# Patient Record
Sex: Female | Born: 1965 | Race: White | Hispanic: Yes | Marital: Married | Smoking: Never smoker
Health system: Southern US, Community
[De-identification: ages and names within clinical notes are randomized; demographics above are authoritative.]

## PROBLEM LIST (undated history)

## (undated) HISTORY — PX: TUBAL LIGATION: SHX77

---

## 2018-08-23 ENCOUNTER — Other Ambulatory Visit: Payer: Self-pay

## 2018-08-30 LAB — CYTOLOGY - PAP: Diagnosis: NEGATIVE

## 2019-07-28 ENCOUNTER — Ambulatory Visit: Payer: Self-pay

## 2019-08-02 ENCOUNTER — Other Ambulatory Visit: Payer: Self-pay

## 2019-08-02 ENCOUNTER — Ambulatory Visit (INDEPENDENT_AMBULATORY_CARE_PROVIDER_SITE_OTHER): Payer: Self-pay | Admitting: Internal Medicine

## 2019-08-02 DIAGNOSIS — R2242 Localized swelling, mass and lump, left lower limb: Secondary | ICD-10-CM

## 2019-08-02 DIAGNOSIS — Z9189 Other specified personal risk factors, not elsewhere classified: Secondary | ICD-10-CM

## 2019-08-02 DIAGNOSIS — M7989 Other specified soft tissue disorders: Secondary | ICD-10-CM

## 2019-08-02 DIAGNOSIS — M5431 Sciatica, right side: Secondary | ICD-10-CM

## 2019-08-02 NOTE — Progress Notes (Signed)
Virtual Visit via Telephone Note Due to current restrictions/limitations of in-office visits due to the COVID-19 pandemic, this scheduled clinical appointment was converted to a telehealth visit  I connected with Tobie Lords on 08/02/19 at 4:45 p.m by telephone and verified that I am speaking with the correct person using two identifiers. I am in my office.  The patient is at home.  Only the patient,myself and Luis from [employer] [phone]) participated in this encounter.  I discussed the limitations, risks, security and privacy concerns of performing an evaluation and management service by telephone and the availability of in person appointments. I also discussed with the patient that there may be a patient responsible charge related to this service. The patient expressed understanding and agreed to proceed.   History of Present Illness: Pt is new to this practice at [place].  Last PCP was in [place], moved to [place] 1 yr ago.  Denies any chronic medical issues and not on any meds beside MV.    C/o having severe pain in the middle of lower back that started 1 wk ago.  Associated with pain in RT leg.  No known precipitating factors.  Reports similar episode 18 yrs ago where she was dx with sciatica and had to have a steroid inj.   -Last wk she Used [employer] and massages from spouse.  Used a brace for next 2 days and tried to avoid excessive activity.  Symptoms have resolved.    C/o swelling in LT leg x 6 mths-1 yr. at the end of the day she reports for the leg feels tired.  Denies any cramps in the leg.  Pain in same leg when weather is call.  Would like dental referral for routine care.  Has been approved for OC.   PFhx: Crohn's and HTN in mother who is deceased.  Father has DM and glaucoma  PSH: tubal ligation Social history: Updated in the system.  HM:  Last pap was 08/2018 in [facility].  Last MMG was over 2 yrs ago.  Due for flu vaccine.  She has appt this weekend to get a  free flu vaccine in the community Observations/Objective: No direct observation done as this was a telephone encounter  Assessment and Plan: 1. Sciatica of right side -Given that symptoms have resolved, no need for medication at this time.  I recommend avoiding heavy lifting, excessive pushing/pulling or any other activity that may cause flareup.  2. Need for dental care Referral submitted for her to see the dentist  3. Left leg swelling This is chronic so I doubt DVT.  Patient was a very poor historian over the phone when trying to get details about this particular symptom.  We will bring her in and several weeks to discuss and evaluate further.   Follow Up Instructions: 4 wks to eval LT leg   I discussed the assessment and treatment plan with the patient. The patient was provided an opportunity to ask questions and all were answered. The patient agreed with the plan and demonstrated an understanding of the instructions.   The patient was advised to call back or seek an in-person evaluation if the symptoms worsen or if the condition fails to improve as anticipated.  I provided 27 minutes of non-face-to-face time during this encounter.   Karle Plumber, MD

## 2019-09-05 ENCOUNTER — Telehealth: Payer: Self-pay

## 2019-09-05 NOTE — Telephone Encounter (Signed)
Called patient to do their pre-visit COVID screening.  Call went to voicemail. Unable to do prescreening.  

## 2019-09-06 ENCOUNTER — Other Ambulatory Visit: Payer: Self-pay

## 2019-09-06 ENCOUNTER — Ambulatory Visit (INDEPENDENT_AMBULATORY_CARE_PROVIDER_SITE_OTHER): Payer: Self-pay | Admitting: Internal Medicine

## 2019-09-06 VITALS — BP 111/79 | HR 84 | Temp 97.3°F | Resp 17 | Ht 66.0 in | Wt 166.2 lb

## 2019-09-06 DIAGNOSIS — R609 Edema, unspecified: Secondary | ICD-10-CM

## 2019-09-06 DIAGNOSIS — Z1211 Encounter for screening for malignant neoplasm of colon: Secondary | ICD-10-CM

## 2019-09-06 DIAGNOSIS — R600 Localized edema: Secondary | ICD-10-CM

## 2019-09-06 NOTE — Progress Notes (Signed)
Patient ID: Hannah Tate, female    DOB: 1966-07-24  MRN: [id number]  CC: Leg Swelling   Subjective: Hannah Tate is a 53 y.o. female who presents for f/u leg edema Her concerns today include:   Pt had tele-visit 1 mth ago.  C/o intermittent swelling in the LT leg x 6-12 mths.  Today she states both legs swell but worse on LT.  Worse when sitting for prolong periods "like when I take long trips."  Also worse when she is ambulatory during the day.  Better when she is laying down and with elevation of legs. Legs feel heavy during the day No pain in the legs.   No SOB at rest or exertion     No current outpatient medications on file prior to visit.   No current facility-administered medications on file prior to visit.     No Known Allergies  Social History   Socioeconomic History  . Marital status: Married    Spouse name: Not on file  . Number of children: 3  . Years of education: 81  . Highest education level: Not on file  Occupational History  . Not on file  Social Needs  . Financial resource strain: Not on file  . Food insecurity    Worry: Not on file    Inability: Not on file  . Transportation needs    Medical: Not on file    Non-medical: Not on file  Tobacco Use  . Smoking status: Never Smoker  . Smokeless tobacco: Never Used  Substance and Sexual Activity  . Alcohol use: Not Currently  . Drug use: Never  . Sexual activity: Not on file  Lifestyle  . Physical activity    Days per week: Not on file    Minutes per session: Not on file  . Stress: Not on file  Relationships  . Social Musician on phone: Not on file    Gets together: Not on file    Attends religious service: Not on file    Active member of club or organization: Not on file    Attends meetings of clubs or organizations: Not on file    Relationship status: Not on file  . Intimate partner violence    Fear of current or ex partner: Not on file    Emotionally abused: Not on file     Physically abused: Not on file    Forced sexual activity: Not on file  Other Topics Concern  . Not on file  Social History Narrative  . Not on file    Family History  Problem Relation Age of Onset  . Hypertension Mother   . Crohn's disease Mother   . Diabetes Father   . Skin cancer Father   . Glaucoma Father   . Glaucoma Sister   . Skin cancer Paternal Uncle     Past Surgical History:  Procedure Laterality Date  . TUBAL LIGATION      ROS: Review of Systems Negative except as stated above  PHYSICAL EXAM: BP 111/79   Pulse 84   Temp (!) 97.3 F (36.3 C) (Temporal)   Resp 17   Ht 5\' 6"  (1.676 m)   Wt 166 lb 3.2 oz (75.4 kg)   SpO2 99%   BMI 26.83 kg/m   Physical Exam  General appearance - alert, well appearing, and in no distress Mental status - normal mood, behavior, speech, dress, motor activity, and thought processes Chest - clear to auscultation,  no wheezes, rales or rhonchi, symmetric air entry Heart - normal rate, regular rhythm, normal S1, S2, no murmurs, rubs, clicks or gallops Extremities -no discrepancy size of the left leg compared to the right.  No edema in the lower legs at this time.  A few spider veins noted on the left lower leg.  Legs are warm.  Good peripheral pulses in the feet popliteal and femoral areas.   No flowsheet data found. Lipid Panel  No results found for: CHOL, TRIG, HDL, CHOLHDL, VLDL, LDLCALC, LDLDIRECT  CBC No results found for: WBC, RBC, HGB, HCT, PLT, MCV, MCH, MCHC, RDW, LYMPHSABS, MONOABS, EOSABS, BASOSABS  ASSESSMENT AND PLAN:  1. Dependent edema History suggests dependent edema.  No swelling noted at this time.  I recommend purchasing and wearing some compression socks during the day when she is up and active and also when she anticipates prolonged sitting.  2. Colon cancer screening Discussed colon cancer screening.  Patient agreeable to doing fit test. - Fecal occult blood, imunochemical([facility]/Sun[facility]);  Future    Patient was given the opportunity to ask questions.  Patient verbalized understanding of the plan and was able to repeat key elements of the plan.  #[id number] Orders Placed This Encounter  Procedures  . Fecal occult blood, imunochemical([facility]/Sun[facility])     Requested Prescriptions    No prescriptions requested or ordered in this encounter    Return if symptoms worsen or fail to improve.  Karle Plumber, MD, FACP

## 2019-09-06 NOTE — Progress Notes (Signed)
Patient following up on B leg swelling. States that the swelling is from her knees down. Swelling does improve when she's off of her feet.  Would like to have A1C done to see if she's still in the prediabetic range.

## 2019-09-16 ENCOUNTER — Other Ambulatory Visit: Payer: Self-pay

## 2019-09-16 ENCOUNTER — Ambulatory Visit: Payer: Self-pay | Attending: Family Medicine

## 2019-09-16 ENCOUNTER — Ambulatory Visit: Payer: Self-pay

## 2020-07-26 ENCOUNTER — Other Ambulatory Visit: Payer: Self-pay | Admitting: Obstetrics and Gynecology

## 2020-07-26 DIAGNOSIS — Z1231 Encounter for screening mammogram for malignant neoplasm of breast: Secondary | ICD-10-CM

## 2020-09-06 ENCOUNTER — Ambulatory Visit
Admission: RE | Admit: 2020-09-06 | Discharge: 2020-09-06 | Disposition: A | Discharge status: Home / Self Care | Payer: No Typology Code available for payment source | Source: Ambulatory Visit | Attending: Obstetrics and Gynecology | Admitting: Obstetrics and Gynecology

## 2020-09-06 ENCOUNTER — Other Ambulatory Visit: Payer: Self-pay

## 2020-09-06 ENCOUNTER — Ambulatory Visit: Payer: No Typology Code available for payment source | Admitting: *Deleted

## 2020-09-06 VITALS — BP 122/82 | Wt 168.4 lb

## 2020-09-06 DIAGNOSIS — Z1239 Encounter for other screening for malignant neoplasm of breast: Secondary | ICD-10-CM

## 2020-09-06 DIAGNOSIS — Z1231 Encounter for screening mammogram for malignant neoplasm of breast: Secondary | ICD-10-CM

## 2020-09-06 DIAGNOSIS — Z1211 Encounter for screening for malignant neoplasm of colon: Secondary | ICD-10-CM

## 2020-09-06 NOTE — Progress Notes (Signed)
[name] is a 54 y.o. female who presents to [facility] clinic today with no complaints.    Pap Smear: Pap not smear completed today. Last Pap smear was 08/23/2018 at the free cervical cancer screening clinic at [facility] and was normal. Per patient has history of an abnormal Pap smear 13 years ago that a colposcopy was completed for follow-up. Patient stated all Pap smears have been normal since colposcopy and has had at least three normal Pap smears. Last Pap smear result is available in Epic.   Physical exam: Breasts Left breast slightly larger than right that per patient is normal for her. No skin abnormalities bilateral breasts. No nipple retraction bilateral breasts. No nipple discharge bilateral breasts. No lymphadenopathy. No lumps palpated bilateral breasts. No complaints of pain or tenderness on exam.      Pelvic/Bimanual Pap is not indicated today per BCCCP guidelines.   Smoking History: Patient has never smoked.   Patient Navigation: Patient education provided. Access to services provided for patient through [facility] program. Spanish interpreter Natale Lay from [facility] provided.   Colorectal Cancer Screening: Per patient has never had colonoscopy completed. FIT Test given to patient to complete. No complaints today.    Breast and Cervical Cancer Risk Assessment: Patient does not have family history of breast cancer, known genetic mutations, or radiation treatment to the chest before age 58. Per patient has history of cervical dysplasia. Patient has no history of being immunocompromised or DES exposure in-utero.  Risk Assessment    Risk Scores      09/06/2020   Last edited by: [name], CMA   5-year risk: 0.5 %   Lifetime risk: 4.5 %          A: BCCCP exam without pap smear No complaints.  P: Referred patient to the [facility] for a screening mammogram on the mobile unit. Appointment scheduled Thursday, September 06, 2020 at 1430.  Priscille Heidelberg, RN 09/06/2020 4:03 PM

## 2020-09-06 NOTE — Patient Instructions (Signed)
Explained breast self awareness with Dawna Part. Patient did not need a Pap smear today due to last Pap smear was 08/23/2018. Let her know BCCCP will cover Pap smears every 3 years unless has a history of abnormal Pap smears. Referred patient to the [facility] for a screening mammogram on the mobile unit. Appointment scheduled Thursday, September 06, 2020 at 1430. Patient escorted to the mobile unit following BCCCP appointment for her screening mammogram. Let patient know the Breast Center will follow up with her within the next couple weeks with results of her mammogram by letter or phone. Dawna Part verbalized understanding.  Zavier Canela, Kathaleen Maser, RN 4:03 PM

## 2020-09-19 ENCOUNTER — Other Ambulatory Visit: Payer: Self-pay | Admitting: Obstetrics and Gynecology

## 2020-09-19 DIAGNOSIS — R928 Other abnormal and inconclusive findings on diagnostic imaging of breast: Secondary | ICD-10-CM

## 2020-10-02 ENCOUNTER — Other Ambulatory Visit: Payer: Self-pay

## 2020-10-02 ENCOUNTER — Ambulatory Visit
Admission: RE | Admit: 2020-10-02 | Discharge: 2020-10-02 | Disposition: A | Discharge status: Home / Self Care | Payer: No Typology Code available for payment source | Source: Ambulatory Visit | Attending: Obstetrics and Gynecology | Admitting: Obstetrics and Gynecology

## 2020-10-02 ENCOUNTER — Other Ambulatory Visit: Payer: Self-pay | Admitting: Obstetrics and Gynecology

## 2020-10-02 DIAGNOSIS — R928 Other abnormal and inconclusive findings on diagnostic imaging of breast: Secondary | ICD-10-CM

## 2020-11-28 ENCOUNTER — Emergency Department: Admit: 2020-11-28 | Discharge status: Absent without leave | Payer: Self-pay

## 2020-11-28 ENCOUNTER — Other Ambulatory Visit: Payer: Self-pay

## 2020-11-28 ENCOUNTER — Emergency Department (INDEPENDENT_AMBULATORY_CARE_PROVIDER_SITE_OTHER)
Admission: EM | Admit: 2020-11-28 | Discharge: 2020-11-28 | Disposition: A | Discharge status: Home / Self Care | Payer: Self-pay | Source: Home / Self Care

## 2020-11-28 DIAGNOSIS — G43919 Migraine, unspecified, intractable, without status migrainosus: Secondary | ICD-10-CM

## 2020-11-28 MED ORDER — CYCLOBENZAPRINE HCL 10 MG PO TABS: 10.0000 mg | ORAL_TABLET | Freq: Every day | ORAL | 0 refills | Status: DC

## 2020-11-28 NOTE — ED Provider Notes (Signed)
[facility]    CSN: [id number] Arrival date & time: 11/28/20  1640      History   Chief Complaint Chief Complaint  Patient presents with  . Headache    HPI Hannah Tate is a 55 y.o. female.   Spanish interpreter utilized to facilitate communication during visit  HPI  Patient with a history of recurrent migrainous headaches Presents today concern that she noticed her temporal vein on right side of her forehead yesterday. She did not experienced a headache at that time but had headache this morning and still noticed the visibility of the right temporal vein today. She denies dizziness, palpable tenderness of vein, weakness, changes in vision, or headache worst than any prior headaches.    History reviewed. No pertinent past medical history.  There are no problems to display for this patient.   Past Surgical History:  Procedure Laterality Date  . TUBAL LIGATION      OB History    Gravida  4   Para      Term      Preterm      AB  1   Living        SAB  1   IAB      Ectopic      Multiple      Live Births  3            Home Medications    Prior to Admission medications   Not on File    Family History Family History  Problem Relation Age of Onset  . Hypertension Mother   . Crohn's disease Mother   . Diabetes Father   . Skin cancer Father   . Glaucoma Father   . Glaucoma Sister   . Skin cancer Paternal Uncle     Social History Social History   Tobacco Use  . Smoking status: Never Smoker  . Smokeless tobacco: Never Used  Vaping Use  . Vaping Use: Never used  Substance Use Topics  . Alcohol use: Not Currently  . Drug use: Never     Allergies   Patient has no known allergies.   Review of Systems Review of Systems Pertinent negatives listed in HPI   Physical Exam Triage Vital Signs ED Triage Vitals  Enc Vitals Group     BP 11/28/20 1739 133/86     Pulse Rate 11/28/20 1739 (!) 118     Resp 11/28/20  1739 18     Temp 11/28/20 1739 98.4 F (36.9 C)     Temp Source 11/28/20 1739 Oral     SpO2 11/28/20 1739 100 %     Weight --      Height --      Head Circumference --      Peak Flow --      Pain Score 11/28/20 1740 6     Pain Loc --      Pain Edu? --      Excl. in GC? --    No data found.  Updated Vital Signs BP 133/86 (BP Location: Right Arm)   Pulse (!) 118   Temp 98.4 F (36.9 C) (Oral)   Resp 18   SpO2 100%   Visual Acuity Right Eye Distance:   Left Eye Distance:   Bilateral Distance:    Right Eye Near:   Left Eye Near:    Bilateral Near:     Physical Exam Constitutional: Patient appears well-developed and well-nourished. No distress. HENT: Normocephalic,  atraumatic, External right and left ear normal. Oropharynx is clear and moist.  Eyes: Conjunctivae and EOM are normal. PERRLA, no scleral icterus. Neck: Normal ROM. Neck supple. No JVD. No tracheal deviation. No thyromegaly. CVS: RRR, S1/S2 +, no murmurs, no gallops, no carotid bruit.  Pulmonary: Effort and breath sounds normal, no stridor, rhonchi, wheezes, rales.  Musculoskeletal: Normal range of motion. No edema and no tenderness.  Neuro: Alert. Normal reflexes, muscle tone coordination. No cranial nerve deficit. Negative of focal deficits. Cerebellar function intact. Negative of nystagmus. Negative Romberg  Skin: Skin is warm and dry. No rash noted. Not diaphoretic. No erythema. No pallor. Psychiatric: Normal mood and affect. Behavior, judgment, thought content normal.  UC Treatments / Results  Labs (all labs ordered are listed, but only abnormal results are displayed) Labs Reviewed - No data to display  EKG   Radiology No results found.  Procedures Procedures (including critical care time)  Medications Ordered in UC Medications - No data to display  Initial Impression / Assessment and Plan / UC Course  I have reviewed the triage vital signs and the nursing notes.  Pertinent labs & imaging  results that were available during my care of the patient were reviewed by me and considered in my medical decision making (see chart for details).    Chronic migraine HA, educated to avoid taking ASA for HA management due to increase risk of GI bleeding. Currently no HA pain. Educated visibility of a temporal painless vein in not a specific diagnostic finding. Overall neurological exam grossly intact.  Muscle relaxer prescribed PRN for HA to take with either ibuprofen or naproxen. Pt is w/o PCP and no recent full PE. Information given to schedule an appt with PCP to have a CPE completed. Red flag signs discussed and symptoms warranting emergent evaluation. Patient verbalized understanding and agreement with plan.   Final Clinical Impressions(s) / UC Diagnoses   Final diagnoses:  Intractable migraine without status migrainosus, unspecified migraine type     Discharge Instructions     For acute headache pain continue management with ibuprofen migraine and Tylenol.  For headache pain and headache tension at bedtime you can take cyclobenzaprine however only take at bedtime as this medication can cause drowsiness.  Schedule follow-up with the provider listed on your paper for complete physical exam and further management of recurrent headaches.    ED Prescriptions    Medication Sig Dispense Auth. Provider   cyclobenzaprine (FLEXERIL) 10 MG tablet Take 1 tablet (10 mg total) by mouth at bedtime. 20 tablet Bing Neighbors, FNP     PDMP not reviewed this encounter.   Bing Neighbors, FNP 12/01/20 2150

## 2020-11-28 NOTE — Discharge Instructions (Addendum)
For acute headache pain continue management with ibuprofen migraine and Tylenol.  For headache pain and headache tension at bedtime you can take cyclobenzaprine however only take at bedtime as this medication can cause drowsiness.  Schedule follow-up with the provider listed on your paper for complete physical exam and further management of recurrent headaches.

## 2020-11-28 NOTE — ED Triage Notes (Signed)
Pt c/o headache x 2 days. Pain in her temporal area. Had covid in December. Had had covid vaccines. Aspirin and tylenol prn. Pain 6/10

## 2021-04-02 ENCOUNTER — Other Ambulatory Visit: Payer: No Typology Code available for payment source

## 2021-04-18 ENCOUNTER — Ambulatory Visit
Admission: RE | Admit: 2021-04-18 | Discharge: 2021-04-18 | Disposition: A | Discharge status: Home / Self Care | Payer: No Typology Code available for payment source | Source: Ambulatory Visit | Attending: Obstetrics and Gynecology | Admitting: Obstetrics and Gynecology

## 2021-04-18 ENCOUNTER — Other Ambulatory Visit: Payer: Self-pay

## 2021-04-18 DIAGNOSIS — R928 Other abnormal and inconclusive findings on diagnostic imaging of breast: Secondary | ICD-10-CM

## 2021-05-02 ENCOUNTER — Other Ambulatory Visit: Payer: Self-pay

## 2021-05-02 ENCOUNTER — Ambulatory Visit (INDEPENDENT_AMBULATORY_CARE_PROVIDER_SITE_OTHER): Payer: Self-pay | Admitting: Family

## 2021-05-02 ENCOUNTER — Encounter: Payer: Self-pay | Admitting: Family

## 2021-05-02 VITALS — BP 122/70 | HR 97 | Temp 97.8°F | Ht 64.96 in | Wt 168.2 lb

## 2021-05-02 DIAGNOSIS — Z0000 Encounter for general adult medical examination without abnormal findings: Secondary | ICD-10-CM

## 2021-05-02 DIAGNOSIS — Z13220 Encounter for screening for lipoid disorders: Secondary | ICD-10-CM

## 2021-05-02 LAB — CBC WITH DIFFERENTIAL/PLATELET
Basophils Absolute: 0 10*3/uL (ref 0.0–0.1)
Basophils Relative: 1 % (ref 0.0–3.0)
Eosinophils Absolute: 0.2 10*3/uL (ref 0.0–0.7)
Eosinophils Relative: 3.9 % (ref 0.0–5.0)
HCT: 36.8 % (ref 36.0–46.0)
Hemoglobin: 12.1 g/dL (ref 12.0–15.0)
Lymphocytes Relative: 34.5 % (ref 12.0–46.0)
Lymphs Abs: 1.6 10*3/uL (ref 0.7–4.0)
MCHC: 33 g/dL (ref 30.0–36.0)
MCV: 82.3 fl (ref 78.0–100.0)
Monocytes Absolute: 0.4 10*3/uL (ref 0.1–1.0)
Monocytes Relative: 8.1 % (ref 3.0–12.0)
Neutro Abs: 2.4 10*3/uL (ref 1.4–7.7)
Neutrophils Relative %: 52.5 % (ref 43.0–77.0)
Platelets: 279 10*3/uL (ref 150.0–400.0)
RBC: 4.47 Mil/uL (ref 3.87–5.11)
RDW: 16.4 % — ABNORMAL HIGH (ref 11.5–15.5)
WBC: 4.6 10*3/uL (ref 4.0–10.5)

## 2021-05-02 LAB — COMPREHENSIVE METABOLIC PANEL
ALT: 18 U/L (ref 0–35)
AST: 16 U/L (ref 0–37)
Albumin: 4.2 g/dL (ref 3.5–5.2)
Alkaline Phosphatase: 62 U/L (ref 39–117)
BUN: 16 mg/dL (ref 6–23)
CO2: 28 mEq/L (ref 19–32)
Calcium: 9.4 mg/dL (ref 8.4–10.5)
Chloride: 105 mEq/L (ref 96–112)
Creatinine, Ser: 0.79 mg/dL (ref 0.40–1.20)
GFR: 85.94 mL/min (ref >60.00)
Glucose, Bld: 87 mg/dL (ref 70–99)
Potassium: 4.5 mEq/L (ref 3.5–5.1)
Sodium: 140 mEq/L (ref 135–145)
Total Bilirubin: 0.3 mg/dL (ref 0.2–1.2)
Total Protein: 7 g/dL (ref 6.0–8.3)

## 2021-05-02 LAB — LIPID PANEL
Cholesterol: 142 mg/dL (ref 0–200)
HDL: 37.5 mg/dL — ABNORMAL LOW (ref >39.00)
LDL Cholesterol: 85 mg/dL (ref 0–99)
NonHDL: 104.63
Total CHOL/HDL Ratio: 4
Triglycerides: 97 mg/dL (ref 0.0–149.0)
VLDL: 19.4 mg/dL (ref 0.0–40.0)

## 2021-05-02 NOTE — Progress Notes (Signed)
Hannah Tate is a 55 y.o. female with the following history as recorded in EpicCare:  There are no problems to display for this patient.   No current outpatient medications on file.   No current facility-administered medications for this visit.    Allergies: Patient has no known allergies.  No past medical history on file.  Past Surgical History:  Procedure Laterality Date   TUBAL LIGATION      Family History  Problem Relation Age of Onset   Hypertension Mother    Crohn's disease Mother    Diabetes Father    Skin cancer Father    Glaucoma Father    Glaucoma Sister    Skin cancer Paternal Uncle     Social History   Tobacco Use   Smoking status: Never   Smokeless tobacco: Never  Substance Use Topics   Alcohol use: Not Currently    Subjective:  Presents today as a new patient; accompanied by translator; no acute concerns- would like to get fasting labs/ CPE today;   Originially from [place]- moved from [place] to [place] 3 years ago to help take care of grandchildren; 1 daughter in [place]/ 2 sons in [place];   Up to date on mammogram; due again in December 2022; she will call the [facility] to get scheduled;   LMP- June/ irregular; last pap smear 08/2018; menstrual migraines- responds well to Excedrin Migraine; does have fluid retention in the week leading up to her period as well;   Review of Systems  Constitutional: Negative.   HENT: Negative.    Eyes: Negative.   Respiratory: Negative.    Cardiovascular: Negative.   Gastrointestinal: Negative.   Genitourinary: Negative.   Musculoskeletal: Negative.   Skin: Negative.   Neurological: Negative.   Endo/Heme/Allergies: Negative.   Psychiatric/Behavioral: Negative.         Objective:  Vitals:   05/02/21 0912  BP: 122/70  Pulse: 97  Temp: 97.8 F (36.6 C)  TempSrc: Oral  SpO2: 99%  Weight: 168 lb 3.2 oz (76.3 kg)  Height: 5' 4.96" (1.65 m)    General: Well developed, well nourished, in no acute  distress  Skin : Warm and dry.  Head: Normocephalic and atraumatic  Eyes: Sclera and conjunctiva clear; pupils round and reactive to light; extraocular movements intact  Ears: External normal; canals clear; tympanic membranes normal  Oropharynx: Pink, supple. No suspicious lesions  Neck: Supple without thyromegaly, adenopathy  Lungs: Respirations unlabored; clear to auscultation bilaterally without wheeze, rales, rhonchi  CVS exam: normal rate and regular rhythm.  Abdomen: Soft; nontender; nondistended; normoactive bowel sounds; no masses or hepatosplenomegaly  Musculoskeletal: No deformities; no active joint inflammation  Extremities: No edema, cyanosis, clubbing  Vessels: Symmetric bilaterally  Neurologic: Alert and oriented; speech intact; face symmetrical; moves all extremities well; CNII-XII intact without focal deficit   Assessment:  1. PE (physical exam), annual   2. Lipid screening     Plan:  Age appropriate preventive healthcare needs addressed; encouraged regular eye doctor and dental exams; encouraged regular exercise; will update labs and refills as needed today; follow-up to be determined; Due to lack of insurance, will postpone vaccines and colon cancer screening at this time; Plan for pap smear later this year- will need to see if [facility] offers free community health screenings;  This visit occurred during the SARS-CoV-2 public health emergency.  Safety protocols were in place, including screening questions prior to the visit, additional usage of staff PPE, and extensive cleaning of exam  room while observing appropriate contact time as indicated for disinfecting solutions.    No follow-ups on file.  Orders Placed This Encounter  Procedures   CBC with Differential/Platelet   Comp Met (CMET)   Lipid panel    Requested Prescriptions    No prescriptions requested or ordered in this encounter

## 2021-08-22 ENCOUNTER — Encounter: Payer: Self-pay | Admitting: Physician Assistant

## 2021-08-22 ENCOUNTER — Ambulatory Visit: Payer: Self-pay | Attending: Physician Assistant | Admitting: Physician Assistant

## 2021-08-22 ENCOUNTER — Other Ambulatory Visit: Payer: Self-pay

## 2021-08-22 VITALS — BP 120/81 | HR 79 | Temp 98.7°F | Resp 18 | Ht 67.0 in | Wt 167.0 lb

## 2021-08-22 DIAGNOSIS — N951 Menopausal and female climacteric states: Secondary | ICD-10-CM | POA: Insufficient documentation

## 2021-08-22 DIAGNOSIS — Z0000 Encounter for general adult medical examination without abnormal findings: Secondary | ICD-10-CM

## 2021-08-22 NOTE — Progress Notes (Signed)
Established Patient Office Visit  Subjective:  Patient ID: Hannah Tate, female    DOB: 07-18-1966  Age: 55 y.o. MRN: [id number]  CC:  Chief Complaint  Patient presents with   Follow-up    wellness    HPI Hannah Tate States that she would like to get a check up with one concern.  States that she is still having menses,states that they are irregular, had one 6 months ago and then just recently had one that lasted three weeks. States that she has been experiencing migraines with her menses now.  States that she will take excedrin migraine with relief.  States that she does have some difficulty with sleep, hard time staying asleep. Has tried taking a warm bath with some relief.  States that she is drinking a lot of water.  Due to language barrier, an interpreter was present during the history-taking and subsequent discussion (and for part of the physical exam) with this patient.   History reviewed. No pertinent past medical history.  Past Surgical History:  Procedure Laterality Date   TUBAL LIGATION      Family History  Problem Relation Age of Onset   Hypertension Mother    Crohn's disease Mother    Diabetes Father    Skin cancer Father    Glaucoma Father    Glaucoma Sister    Skin cancer Paternal Uncle     Social History   Socioeconomic History   Marital status: Married    Spouse name: Not on file   Number of children: 3   Years of education: 12   Highest education level: Not on file  Occupational History   Not on file  Tobacco Use   Smoking status: Never   Smokeless tobacco: Never  Vaping Use   Vaping Use: Never used  Substance and Sexual Activity   Alcohol use: Not Currently   Drug use: Never   Sexual activity: Yes    Birth control/protection: Surgical  Other Topics Concern   Not on file  Social History Narrative   Not on file   Social Determinants of Health   Financial Resource Strain: Not on file  Food Insecurity: Not on file   Transportation Needs: No Transportation Needs   Lack of Transportation (Medical): No   Lack of Transportation (Non-Medical): No  Physical Activity: Not on file  Stress: Not on file  Social Connections: Not on file  Intimate Partner Violence: Not on file    No outpatient medications prior to visit.   No facility-administered medications prior to visit.    No Known Allergies  ROS Review of Systems  Constitutional:  Negative for chills and fever.  HENT: Negative.    Eyes:  Negative for photophobia.  Respiratory:  Negative for shortness of breath.   Cardiovascular:  Negative for chest pain.  Gastrointestinal:  Negative for diarrhea, nausea and vomiting.  Endocrine: Negative.   Genitourinary:  Positive for menstrual problem. Negative for dysuria and vaginal discharge.  Musculoskeletal: Negative.   Skin: Negative.   Allergic/Immunologic: Negative.   Neurological:  Positive for headaches. Negative for dizziness, speech difficulty and weakness.  Hematological: Negative.   Psychiatric/Behavioral: Negative.       Objective:    Physical Exam Vitals and nursing note reviewed.  Constitutional:      Appearance: Normal appearance.  HENT:     Head: Normocephalic and atraumatic.     Right Ear: External ear normal.     Left Ear: External ear normal.  Nose: Nose normal.     Mouth/Throat:     Mouth: Mucous membranes are moist.     Pharynx: Oropharynx is clear.  Eyes:     Extraocular Movements: Extraocular movements intact.     Conjunctiva/sclera: Conjunctivae normal.     Pupils: Pupils are equal, round, and reactive to light.  Cardiovascular:     Rate and Rhythm: Normal rate and regular rhythm.     Pulses: Normal pulses.     Heart sounds: Normal heart sounds.  Pulmonary:     Effort: Pulmonary effort is normal.     Breath sounds: Normal breath sounds.  Musculoskeletal:        General: Normal range of motion.     Cervical back: Normal range of motion and neck supple.   Skin:    General: Skin is warm and dry.  Neurological:     General: No focal deficit present.     Mental Status: She is alert and oriented to person, place, and time.  Psychiatric:        Mood and Affect: Mood normal.        Behavior: Behavior normal.        Thought Content: Thought content normal.        Judgment: Judgment normal.    BP 120/81 (BP Location: Left Arm, Patient Position: Sitting, Cuff Size: Normal)   Pulse 79   Temp 98.7 F (37.1 C) (Oral)   Resp 18   Ht 5\' 7"  (1.702 m)   Wt 167 lb (75.8 kg)   LMP 08/19/2021   SpO2 98%   BMI 26.16 kg/m  Wt Readings from Last 3 Encounters:  08/22/21 167 lb (75.8 kg)  05/02/21 168 lb 3.2 oz (76.3 kg)  09/06/20 168 lb 6.4 oz (76.4 kg)     Health Maintenance Due  Topic Date Due   HIV Screening  Never done   Hepatitis C Screening  Never done   COLON CANCER SCREENING ANNUAL FOBT  Never done   Zoster Vaccines- Shingrix (1 of 2) Never done   INFLUENZA VACCINE  06/10/2021   PAP SMEAR-Modifier  08/23/2021    There are no preventive care reminders to display for this patient.  No results found for: TSH Lab Results  Component Value Date   WBC 4.6 05/02/2021   HGB 12.1 05/02/2021   HCT 36.8 05/02/2021   MCV 82.3 05/02/2021   PLT 279.0 05/02/2021   Lab Results  Component Value Date   NA 140 05/02/2021   K 4.5 05/02/2021   CO2 28 05/02/2021   GLUCOSE 87 05/02/2021   BUN 16 05/02/2021   CREATININE 0.79 05/02/2021   BILITOT 0.3 05/02/2021   ALKPHOS 62 05/02/2021   AST 16 05/02/2021   ALT 18 05/02/2021   PROT 7.0 05/02/2021   ALBUMIN 4.2 05/02/2021   CALCIUM 9.4 05/02/2021   GFR 85.94 05/02/2021   Lab Results  Component Value Date   CHOL 142 05/02/2021   Lab Results  Component Value Date   HDL 37.50 (L) 05/02/2021   Lab Results  Component Value Date   LDLCALC 85 05/02/2021   Lab Results  Component Value Date   TRIG 97.0 05/02/2021   Lab Results  Component Value Date   CHOLHDL 4 05/02/2021   No  results found for: HGBA1C    Assessment & Plan:   Problem List Items Addressed This Visit   None Visit Diagnoses     Wellness examination    -  Primary   Perimenopausal  No orders of the defined types were placed in this encounter. 1. Wellness examination Reviewed labs patient recently had completed in June 2022.  No other labs due at this time.  Patient up-to-date on mammogram. Patient was given appointment [facility].  2. Perimenopausal Patient education given on supportive care.   I have reviewed the patient's medical history (PMH, PSH, Social History, Family History, Medications, and allergies) , and have been updated if relevant. I spent 30 minutes reviewing chart and  face to face time with patient.     Follow-up: Return if symptoms worsen or fail to improve.    Kasandra Knudsen Mayers, PA-C

## 2021-08-22 NOTE — Progress Notes (Signed)
Patient has eaten and has not taken medication. Patient denies pain at this time

## 2021-08-22 NOTE — Patient Instructions (Addendum)
I encourage you to continue working on staying well hydrated, using Excedrin as needed and make sure that you are sleeping 7-8 hours a night.  You can try melatonin if needed for insomnia.  Please let us know if there is anything else we can do for you.  Roney Jaffe, PA-C Physician Assistant [facility] [email or web address]  Perimenopausia Perimenopause La perimenopausia es el momento normal de la vida de una mujer en [employer] niveles de estrgeno, la hormona femenina producida por los ovarios, comienzan a disminuir. Esto provoca cambios en los perodos menstruales antes de que se detengan por completo (menopausia). La perimenopausia puede comenzar de 2 a 8 aos antes de [employer]. Durante la perimenopausia, los ovarios pueden o no producir vulos y Architectural technologist an puede quedar [place]. Cules son las causas? La perimenopausia es causada por un cambio natural en los niveles hormonales que se produce a medida que las mujeres envejecen. Qu incrementa el riesgo? Es ms probable que la perimenopausia comience a una edad ms temprana si tiene ciertas afecciones o se ha realizado ciertos tratamientos, incluidos los siguientes: Un tumor en la hipfisis del cerebro. Una enfermedad que afecte los ovarios y la produccin de hormonas. Ciertos tratamientos para el cncer, como quimioterapia o terapia hormonal, o radioterapia en la pelvis. Fumar mucho y consumir alcohol de forma excesiva. Antecedentes familiares de menopausia temprana. Cules son los signos o sntomas? Los cambios por la perimenopausia afectan de [place] diferente a Journalist, newspaper. Los sntomas de esta afeccin pueden incluir los siguientes: Engineer, maintenance. Perodos menstruales irregulares. Sudoracin nocturna. Cambios en el sentimiento respecto de las [employer]. Es posible que el deseo sexual disminuya o que se sienta ms incmoda respecto de su  sexualidad. Sequedad vaginal. Dolores de cabeza. Cambios en el estado de nimo. Depresin. Problemas para dormir (insomnio). Problemas de memoria o dificultad para concentrarse. Irritabilidad. Cansancio. Aumento de [place]. Ansiedad. Problemas para quedar embarazada. Cmo se diagnostica? Esta afeccin se diagnostica en funcin de los antecedentes mdicos, un examen fsico, la edad, los antecedentes menstruales y los sntomas. Tambin le podran realizar estudios hormonales. Cmo se trata? En algunos los casos, no se necesita tratamiento. Usted y el mdico deben decidir juntos si se debe Pensions consultant. El tratamiento se determinar en funcin de su cuadro clnico y de sus preferencias. Existen varios tratamientos disponibles, como: Terapia hormonal para la menopausia. Medicamentos para tratar sntomas especficos. Acupuntura. Vitaminas o suplementos herbales. Antes de Microbiologist, es importante que le avise al mdico si tiene antecedentes personales o familiares de lo siguiente: Enfermedad cardaca. Cncer de mama. Cogulos de [place]. Diabetes. Osteoporosis. Siga estas instrucciones en su casa: Medicamentos Use los medicamentos de venta libre y los recetados solamente como se lo haya indicado el mdico. Tome los suplementos vitamnicos solamente como se lo haya indicado el mdico. Hable con el mdico antes de Corporate investment banker a tomar cualquier suplemento herbal. Estilo de vida  No consuma ningn producto que contenga nicotina o tabaco, como cigarrillos, cigarrillos electrnicos y tabaco de Theatre manager. Si necesita ayuda para dejar de consumir estos productos, consulte al mdico. Realice, por lo menos, 30 minutos de actividad fsica 5 das por semana o ms. Siga una dieta equilibrada que incluya frutas y verduras frescas, cereales integrales, soja, huevos, carnes magras y lcteos descremados. Evite las bebidas con alcohol o cafena, as como las [employer]  Esto podra ayudar a prevenir los acaloramientos. Intente dormir de 7 a 8 horas todas las noches. Vstase con capas de  prendas que pueda quitarse para Clinical cytogeneticist. Encuentre modos de [employer], por ejemplo, a travs de la respiracin, meditacin o un diario ntimo. Indicaciones generales  Lleve un registro de sus perodos menstruales; incluya lo siguiente: El momento en que ocurren. Qu tan abundantes son y cunto duran. Cunto tiempo transcurre entre cada perodo menstrual. Lleve un registro de los sntomas; anote cundo comienzan, con qu frecuencia ocurren y cunto duran. Use lubricantes o humectantes vaginales para aliviar la sequedad vaginal y Personnel officer durante las relaciones sexuales. An puede quedar embarazada si tiene periodos irregulares. Asegrese de [employer] anticonceptivos durante la perimenopausia si no desea quedar embarazada. Cumpla con todas las visitas de seguimiento. Esto es importante. Esto incluye la terapia grupal y la psicoterapia. Comunquese con un mdico si: Tiene una hemorragia vaginal abundante o despide cogulos de sangre. Su perodo menstrual dura ms de 2 das que lo habitual. Sus perodos [employer] se producen con Counsellor que [place]. Sangra despus de [employer]. Siente dolor durante las [employer]. Solicite ayuda de inmediato si tiene: Journalist, newspaper, dificultad para respirar o dificultad para hablar. Depresin grave. Dolor al Beatrix Shipper. Dolor de cabeza intenso. Problemas de visin. Resumen La perimenopausia es el perodo en el cual el cuerpo de la mujer comienza a pasar a la menopausia. Esto puede suceder naturalmente o como consecuencia de otros problemas de salud o tratamientos mdicos. La perimenopausia puede [employer] 2 y 8 aos antes de la menopausia y suele durar varios aos. Los sntomas de la perimenopausia pueden controlarse con medicamentos, cambios en el estilo de vida y  tratamientos complementarios, como la acupuntura. Esta informacin no tiene Theme park manager el consejo del mdico. Asegrese de hacerle al mdico cualquier pregunta que tenga. Document Revised: 05/29/2020 Document Reviewed: 05/29/2020 Elsevier Patient Education  2022 [employer].

## 2021-08-30 ENCOUNTER — Other Ambulatory Visit: Payer: Self-pay

## 2021-08-30 ENCOUNTER — Ambulatory Visit: Payer: Self-pay | Attending: Family Medicine

## 2021-09-12 ENCOUNTER — Other Ambulatory Visit: Payer: Self-pay

## 2021-09-12 DIAGNOSIS — N649 Disorder of breast, unspecified: Secondary | ICD-10-CM

## 2021-10-24 ENCOUNTER — Ambulatory Visit
Admission: RE | Admit: 2021-10-24 | Discharge: 2021-10-24 | Disposition: A | Discharge status: Home / Self Care | Payer: No Typology Code available for payment source | Source: Ambulatory Visit | Attending: Obstetrics and Gynecology | Admitting: Obstetrics and Gynecology

## 2021-10-24 ENCOUNTER — Ambulatory Visit: Payer: Self-pay | Admitting: *Deleted

## 2021-10-24 ENCOUNTER — Other Ambulatory Visit: Payer: Self-pay

## 2021-10-24 VITALS — BP 122/70 | Wt 164.4 lb

## 2021-10-24 DIAGNOSIS — N649 Disorder of breast, unspecified: Secondary | ICD-10-CM

## 2021-10-24 DIAGNOSIS — Z01419 Encounter for gynecological examination (general) (routine) without abnormal findings: Secondary | ICD-10-CM

## 2021-10-24 DIAGNOSIS — Z1211 Encounter for screening for malignant neoplasm of colon: Secondary | ICD-10-CM

## 2021-10-24 NOTE — Patient Instructions (Signed)
Explained breast self awareness with Dawna Part. Pap smear completed today. Let her know BCCCP will cover Pap smears and HPV typing every 5 years unless has a history of abnormal Pap smears. Referred patient to the [facility] for a diagnostic mammogram and bilateral breast ultrasound per recommendation. Appointment scheduled Thursday, October 24, 2021 at 1310. Patient aware of appointment and will be there. Let patient know will follow up with her within the next couple weeks with results of Pap smear by phone. Dawna Part verbalized understanding.  Kaitlen Redford, Kathaleen Maser, RN 10:50 AM

## 2021-10-24 NOTE — Progress Notes (Signed)
[name] is a 55 y.o. [id number] female who presents to [facility] today with complaint of left outer breast pain during menstrual period. Patient's previous bilateral diagnostic mammogram and bilateral breast ultrasound on 04/18/2021 was probably benign with 44-month follow-up bilateral breast ultrasound recommended.    Pap Smear: Pap smear completed today. Last Pap smear was 08/23/2018 at the free cervical cancer screening clinic at [facility] and was normal. Per patient has history of an abnormal Pap smear 14 years ago that a colposcopy was completed for follow-up. Patient stated all Pap smears have been normal since colposcopy and has had at least three normal Pap smears. Last Pap smear result is available in Epic.   Physical exam: Breasts Left breast slightly larger than right that per patient is normal for her. No skin abnormalities bilateral breasts. No nipple retraction bilateral breasts. No nipple discharge bilateral breasts. No lymphadenopathy. No lumps palpated bilateral breasts. No complaints of pain or tenderness on exam.       MS DIGITAL SCREENING TOMO BILATERAL  Result Date: 09/17/2020 CLINICAL DATA:  Screening. EXAM: DIGITAL SCREENING BILATERAL MAMMOGRAM WITH TOMO AND CAD COMPARISON:  None. ACR Breast Density Category c: The breast tissue is heterogeneously dense, which may obscure small masses. FINDINGS: In the right breast, a possible mass warrants further evaluation. In the left breast, a possible mass warrants further evaluation. Images were processed with CAD. IMPRESSION: Further evaluation is suggested for possible masses in the right and left breasts. RECOMMENDATION: Diagnostic mammogram and possibly ultrasound of the right and left breasts. (Code:[id number]-B-44M) The patient will be contacted regarding the findings, and additional imaging will be scheduled. BI-RADS CATEGORY  0: Incomplete. Need additional imaging evaluation and/or prior mammograms for  comparison. Electronically Signed   By: Amie Portland M.D.   On: 09/17/2020 15:29   MS DIGITAL DIAG TOMO BILAT  Result Date: 10/02/2020 CLINICAL DATA:  55 year old female for further evaluation of possible bilateral breast masses on new baseline screening mammogram. EXAM: DIGITAL DIAGNOSTIC BILATERAL MAMMOGRAM WITH TOMO ULTRASOUND BILATERAL BREAST COMPARISON:  Previous exam(s). ACR Breast Density Category c: The breast tissue is heterogeneously dense, which may obscure small masses. FINDINGS: 2D/3D spot compression views of both breasts are performed. A persistent circumscribed oval mass within the OUTER RIGHT breast is noted. A persistent circumscribed oval mass within the UPPER-OUTER LEFT breast is identified. Targeted ultrasound is performed, showing the following: A 0.6 x 0.4 x 0.7 cm circumscribed oval near anechoic mass at the 9 o'clock position of the RIGHT breast 3 cm from the nipple, corresponding to the screening study finding and most likely representing a complicated cyst. 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 IMPRESSION: 1. 0.7 cm likely benign mass/complicated cyst within the OUTER RIGHT breast corresponding to the screening study finding. Six-month follow-up recommended. 2. 2.1 cm likely benign mass/probable fibroadenoma within the UPPER OUTER LEFT breast corresponding to the screening study finding. Six-month follow-up recommended. RECOMMENDATION: Bilateral breast ultrasound in 6 months. I have discussed the findings and recommendations with the patient. If applicable, a reminder letter will be sent to the patient regarding the next appointment. BI-RADS CATEGORY  3: Probably benign. Electronically Signed   By: Harmon Pier M.D.   On: 10/02/2020 09:56    Pelvic/Bimanual Ext Genitalia No lesions, no swelling and no  discharge observed on external genitalia.        Vagina Vagina pink and normal texture. No lesions or discharge observed in vagina.        Cervix Cervix is present. Cervix pink and of normal texture. Cervix friable. No discharge observed.    Uterus Uterus is present and palpable. Uterus in normal position and normal size.        Adnexae Bilateral ovaries present and palpable. No tenderness on palpation.         Rectovaginal No rectal exam completed today since patient had no rectal complaints. No skin abnormalities observed on exam.     Smoking History: Patient has never smoked.   Patient Navigation: Patient education provided. Access to services provided for patient through [place] program. Spanish interpreter Natale Lay from [facility] provided.   Colorectal Cancer Screening: Per patient has never had colonoscopy completed. FIT Test given to patient to complete. No complaints today.    Breast and Cervical Cancer Risk Assessment: Patient does not have family history of breast cancer, known genetic mutations, or radiation treatment to the chest before age 79. Per patient has history of cervical dysplasia. Patient has no history of being immunocompromised or DES exposure in-utero.  Risk Assessment     Risk Scores       10/24/2021 09/06/2020   Last edited by: Narda Rutherford, LPN [name], CMA   5-year risk: 0.5 % 0.5 %   Lifetime risk: 4.3 % 4.5 %            A: BCCCP exam with pap smear Complaint of left outer breast pain during menstrual period.  P: Referred patient to the [facility] for a diagnostic mammogram and bilateral breast ultrasound per recommendation. Appointment scheduled Thursday, October 24, 2021 at 1310.  Priscille Heidelberg, RN 10/24/2021 10:50 AM

## 2021-10-29 ENCOUNTER — Telehealth: Payer: Self-pay

## 2021-10-29 LAB — CYTOLOGY - PAP
Comment: NEGATIVE
Diagnosis: NEGATIVE
High risk HPV: NEGATIVE

## 2021-10-29 NOTE — Telephone Encounter (Signed)
Via Natale Lay, Spanish Interpreter [facility]), Patient informed negative pap/HPV results, next pap due in 3 years. Patient verbalized understanding.

## 2022-09-01 ENCOUNTER — Other Ambulatory Visit: Payer: Self-pay

## 2022-09-01 DIAGNOSIS — Z87898 Personal history of other specified conditions: Secondary | ICD-10-CM

## 2022-09-22 ENCOUNTER — Ambulatory Visit (INDEPENDENT_AMBULATORY_CARE_PROVIDER_SITE_OTHER): Payer: Self-pay | Admitting: Emergency Medicine

## 2022-09-22 ENCOUNTER — Encounter: Payer: Self-pay | Admitting: Emergency Medicine

## 2022-09-22 VITALS — BP 116/86 | HR 75 | Temp 97.9°F | Ht 67.0 in | Wt 163.1 lb

## 2022-09-22 DIAGNOSIS — Z114 Encounter for screening for human immunodeficiency virus [HIV]: Secondary | ICD-10-CM

## 2022-09-22 DIAGNOSIS — Z1159 Encounter for screening for other viral diseases: Secondary | ICD-10-CM

## 2022-09-22 DIAGNOSIS — Z7689 Persons encountering health services in other specified circumstances: Secondary | ICD-10-CM

## 2022-09-22 DIAGNOSIS — Z0000 Encounter for general adult medical examination without abnormal findings: Secondary | ICD-10-CM

## 2022-09-22 DIAGNOSIS — Z1329 Encounter for screening for other suspected endocrine disorder: Secondary | ICD-10-CM

## 2022-09-22 DIAGNOSIS — Z8669 Personal history of other diseases of the nervous system and sense organs: Secondary | ICD-10-CM

## 2022-09-22 DIAGNOSIS — Z13228 Encounter for screening for other metabolic disorders: Secondary | ICD-10-CM

## 2022-09-22 DIAGNOSIS — Z1211 Encounter for screening for malignant neoplasm of colon: Secondary | ICD-10-CM

## 2022-09-22 DIAGNOSIS — Z13220 Encounter for screening for lipoid disorders: Secondary | ICD-10-CM

## 2022-09-22 DIAGNOSIS — Z130 Encounter for screening for diseases of the blood and blood-forming organs and certain disorders involving the immune mechanism: Secondary | ICD-10-CM

## 2022-09-22 LAB — COMPREHENSIVE METABOLIC PANEL
ALT: 19 U/L (ref 0–35)
AST: 19 U/L (ref 0–37)
Albumin: 4.5 g/dL (ref 3.5–5.2)
Alkaline Phosphatase: 59 U/L (ref 39–117)
BUN: 13 mg/dL (ref 6–23)
CO2: 28 mEq/L (ref 19–32)
Calcium: 9.8 mg/dL (ref 8.4–10.5)
Chloride: 104 mEq/L (ref 96–112)
Creatinine, Ser: 0.81 mg/dL (ref 0.40–1.20)
GFR: 82.59 mL/min (ref >60.00)
Glucose, Bld: 96 mg/dL (ref 70–99)
Potassium: 4 mEq/L (ref 3.5–5.1)
Sodium: 140 mEq/L (ref 135–145)
Total Bilirubin: 0.3 mg/dL (ref 0.2–1.2)
Total Protein: 7.5 g/dL (ref 6.0–8.3)

## 2022-09-22 LAB — CBC WITH DIFFERENTIAL/PLATELET
Basophils Absolute: 0 10*3/uL (ref 0.0–0.1)
Basophils Relative: 1 % (ref 0.0–3.0)
Eosinophils Absolute: 0.3 10*3/uL (ref 0.0–0.7)
Eosinophils Relative: 5.3 % — ABNORMAL HIGH (ref 0.0–5.0)
HCT: 42.2 % (ref 36.0–46.0)
Hemoglobin: 14.3 g/dL (ref 12.0–15.0)
Lymphocytes Relative: 30.1 % (ref 12.0–46.0)
Lymphs Abs: 1.5 10*3/uL (ref 0.7–4.0)
MCHC: 34 g/dL (ref 30.0–36.0)
MCV: 93.4 fl (ref 78.0–100.0)
Monocytes Absolute: 0.3 10*3/uL (ref 0.1–1.0)
Monocytes Relative: 5.1 % (ref 3.0–12.0)
Neutro Abs: 2.9 10*3/uL (ref 1.4–7.7)
Neutrophils Relative %: 58.5 % (ref 43.0–77.0)
Platelets: 268 10*3/uL (ref 150.0–400.0)
RBC: 4.52 Mil/uL (ref 3.87–5.11)
RDW: 12.1 % (ref 11.5–15.5)
WBC: 4.9 10*3/uL (ref 4.0–10.5)

## 2022-09-22 LAB — LIPID PANEL
Cholesterol: 149 mg/dL (ref 0–200)
HDL: 35.7 mg/dL — ABNORMAL LOW (ref >39.00)
LDL Cholesterol: 92 mg/dL (ref 0–99)
NonHDL: 113.75
Total CHOL/HDL Ratio: 4
Triglycerides: 110 mg/dL (ref 0.0–149.0)
VLDL: 22 mg/dL (ref 0.0–40.0)

## 2022-09-22 LAB — HEMOGLOBIN A1C: Hgb A1c MFr Bld: 5.7 % (ref 4.6–6.5)

## 2022-09-22 NOTE — Progress Notes (Signed)
Dawna PartSandra E Jara Aguilar 56 y.o.   Chief Complaint  Patient presents with   New Patient (Initial Visit)    Patient wants a physical, patient experiencing allergies, and menopause symptoms , pain in leg.     HISTORY OF PRESENT ILLNESS: This is a 56 y.o. female first visit to this office, here to establish care with me and get a physical. Perimenopausal.  Past medical history of migraine headaches. Non-smoker.  Healthy lifestyle. [place] Recently seen and evaluated by GYN service.  Had Pap smear. Has history of bilateral breast masses.  Negative for mammograms.  Follow-up mammogram scheduled for next month.  HPI   Prior to Admission medications   Not on File    No Known Allergies  Patient Active Problem List   Diagnosis Date Noted   Perimenopausal 08/22/2021    History reviewed. No pertinent past medical history.  Past Surgical History:  Procedure Laterality Date   TUBAL LIGATION      Social History   Socioeconomic History   Marital status: Married    Spouse name: Not on file   Number of children: 3   Years of education: 12   Highest education level: 9th grade  Occupational History   Not on file  Tobacco Use   Smoking status: Never   Smokeless tobacco: Never  Vaping Use   Vaping Use: Never used  Substance and Sexual Activity   Alcohol use: Not Currently   Drug use: Never   Sexual activity: Yes    Birth control/protection: Surgical  Other Topics Concern   Not on file  Social History Narrative   Not on file   Social Determinants of Health   Financial Resource Strain: Not on file  Food Insecurity: No Food Insecurity (10/24/2021)   Hunger Vital Sign    Worried About Running Out of Food in the Last Year: Never true    Ran Out of Food in the Last Year: Never true  Transportation Needs: No Transportation Needs (10/24/2021)   PRAPARE - Administrator, Civil ServiceTransportation    Lack of Transportation (Medical): No    Lack of Transportation (Non-Medical): No   Physical Activity: Not on file  Stress: Not on file  Social Connections: Not on file  Intimate Partner Violence: Not on file    Family History  Problem Relation Age of Onset   Hypertension Mother    Crohn's disease Mother    Diabetes Father    Skin cancer Father    Glaucoma Father    Glaucoma Sister    Skin cancer Paternal Uncle      Review of Systems  Constitutional: Negative.  Negative for chills and fever.  HENT: Negative.  Negative for congestion and sore throat.   Respiratory: Negative.  Negative for cough and shortness of breath.   Cardiovascular: Negative.  Negative for chest pain and palpitations.  Gastrointestinal: Negative.  Negative for abdominal pain, diarrhea, nausea and vomiting.  Genitourinary: Negative.  Negative for dysuria and hematuria.  Musculoskeletal:        Occasional left leg pain  Skin: Negative.  Negative for rash.  Neurological: Negative.  Negative for dizziness and headaches.  All other systems reviewed and are negative.  Today's Vitals   09/22/22 0841  BP: 116/86  Pulse: 75  Temp: 97.9 F (36.6 C)  TempSrc: Oral  SpO2: 97%  Weight: 163 lb 2 oz (74 kg)  Height: 5\' 7"  (1.702 m)   Body mass index is 25.55 kg/m.   Physical Exam Vitals reviewed.  Constitutional:      Appearance: Normal appearance.  HENT:     Head: Normocephalic.     Right Ear: Tympanic membrane, ear canal and external ear normal.     Left Ear: Tympanic membrane, ear canal and external ear normal.     Mouth/Throat:     Mouth: Mucous membranes are moist.     Pharynx: Oropharynx is clear.  Eyes:     Extraocular Movements: Extraocular movements intact.     Conjunctiva/sclera: Conjunctivae normal.     Pupils: Pupils are equal, round, and reactive to light.  Cardiovascular:     Rate and Rhythm: Normal rate and regular rhythm.     Pulses: Normal pulses.     Heart sounds: Normal heart sounds.  Pulmonary:     Effort: Pulmonary effort is normal.     Breath sounds:  Normal breath sounds.  Abdominal:     Palpations: Abdomen is soft.     Tenderness: There is no abdominal tenderness.  Musculoskeletal:        General: Normal range of motion.     Cervical back: No tenderness.  Lymphadenopathy:     Cervical: No cervical adenopathy.  Skin:    General: Skin is warm and dry.  Neurological:     General: No focal deficit present.     Mental Status: She is alert and oriented to person, place, and time.  Psychiatric:        Mood and Affect: Mood normal.        Behavior: Behavior normal.      ASSESSMENT & PLAN: Problem List Items Addressed This Visit   None Visit Diagnoses     Routine general medical examination at a health care facility    -  Primary   Encounter to establish care       Need for hepatitis C screening test       Relevant Orders   Hepatitis C antibody screen   Screening for HIV (human immunodeficiency virus)       Relevant Orders   HIV antibody   Colon cancer screening       Relevant Orders   Ambulatory referral to Gastroenterology   Screening for deficiency anemia       Relevant Orders   CBC with Differential   Screening for lipoid disorders       Relevant Orders   Lipid panel   Screening for endocrine, metabolic and immunity disorder       Relevant Orders   Comprehensive metabolic panel   Hemoglobin A1c   History of migraine          Modifiable risk factors discussed with patient. Anticipatory guidance according to age provided. The following topics were also discussed: Social Determinants of Health Smoking.  Non-smoker Diet and nutrition.  Good eating habits Benefits of exercise Cancer screening and need for colon cancer screening with colonoscopy. History of bilateral breast masses.  Scheduled for follow-up mammogram/ultrasound next month Vaccinations reviewed and recommendations Cardiovascular risk assessment and need for blood work Mental health including depression and anxiety Fall and accident  prevention  Patient Instructions  Mantenimiento de Radiographer, therapeutic en las mujeres Health Maintenance, Female Adoptar un estilo de vida saludable y recibir atencin preventiva son importantes para promover la salud y Counsellor. Consulte al mdico sobre: El esquema adecuado para hacerse pruebas y exmenes peridicos. Cosas que puede hacer por su cuenta para prevenir enfermedades y [place] sano. Qu debo saber sobre la dieta, el peso y el ejercicio? Consuma  una dieta saludable  Consuma una dieta que incluya muchas verduras, frutas, productos lcteos con bajo contenido de [place] y Associate Professor. No consuma muchos alimentos ricos en grasas slidas, azcares agregados o sodio. Mantenga un peso saludable El ndice de masa muscular [facility]) se [place] para identificar problemas de [place]. Proporciona una estimacin de la grasa corporal basndose en el peso y la altura. Su mdico puede ayudarle a Engineer, site IMC y a Personnel officer o Pharmacologist un peso saludable. Haga ejercicio con regularidad Haga ejercicio con regularidad. Esta es una de las prcticas ms importantes que puede hacer por su salud. La [employer] de los adultos deben seguir estas pautas: Education officer, environmental, al menos, 150 minutos de actividad fsica por semana. El ejercicio debe aumentar la frecuencia cardaca y Media planner transpirar (ejercicio de intensidad moderada). Hacer ejercicios de fortalecimiento por lo [employer] por semana. Agregue esto a su plan de ejercicio de intensidad moderada. Pase menos tiempo sentada. Incluso la actividad fsica ligera puede ser beneficiosa. Controle sus niveles de colesterol y lpidos en la sangre Comience a realizarse anlisis de lpidos y Oncologist en la sangre a los 20 aos y luego reptalos cada 5 aos. Hgase controlar los niveles de colesterol con mayor frecuencia si: Sus niveles de lpidos y colesterol son altos. Es mayor de 40 aos. Presenta un alto riesgo de padecer enfermedades cardacas. Qu debo saber sobre  las pruebas de deteccin del cncer? Segn su historia clnica y sus antecedentes familiares, es posible que deba realizarse pruebas de deteccin del cncer en diferentes edades. Esto puede incluir pruebas de deteccin de lo siguiente: Cncer de mama. Cncer de cuello uterino. Cncer colorrectal. Cncer de piel. Cncer de pulmn. Qu debo saber sobre la enfermedad cardaca, la diabetes y la hipertensin arterial? Presin arterial y enfermedad cardaca La hipertensin arterial causa enfermedades cardacas y [place] el riesgo de accidente cerebrovascular. Es ms probable que esto se manifieste en las personas que tienen lecturas de presin arterial alta o tienen sobrepeso. Hgase controlar la presin arterial: Cada 3 a 5 aos si tiene entre 18 y 16 aos. Todos los aos si es mayor de 40 aos. Diabetes Realcese exmenes de deteccin de la diabetes con regularidad. Este anlisis revisa el nivel de azcar en la sangre en [place]. Hgase las pruebas de deteccin: Cada tres aos despus de los 40 aos de edad si tiene un peso normal y un bajo riesgo de padecer diabetes. Con ms frecuencia y a partir de [place] edad inferior si tiene sobrepeso o un alto riesgo de padecer diabetes. Qu debo saber sobre la prevencin de infecciones? Hepatitis B Si tiene un riesgo ms alto de contraer hepatitis B, debe someterse a un examen de deteccin de este virus. Hable con el mdico para averiguar si tiene riesgo de contraer la infeccin por hepatitis B. Hepatitis C Se recomienda el anlisis a: [employer] 1945 y 1965. Todas las personas que tengan un riesgo de haber contrado hepatitis C. Enfermedades de transmisin sexual (ETS) Hgase las pruebas de Airline pilot de ITS, incluidas la gonorrea y la clamidia, si: Es sexualmente activa y es menor de [address]. Es mayor de [address], y Public affairs consultant informa que corre riesgo de tener este tipo de infecciones. La actividad sexual ha cambiado desde que le  hicieron la ltima prueba de deteccin y tiene un riesgo mayor de Warehouse manager clamidia o Copy. Pregntele al mdico si usted tiene riesgo. Pregntele al mdico si usted tiene un alto riesgo de Primary school teacher VIH. El mdico tambin  puede recomendarle un medicamento recetado para ayudar a evitar la infeccin por el VIH. Si elige tomar medicamentos para prevenir el VIH, primero debe [employer] de deteccin del VIH. Luego debe hacerse anlisis cada 3 meses mientras est tomando los medicamentos. Embarazo Si est por dejar de Armed forces training and education officer (fase premenopusica) y usted puede quedar [place], busque asesoramiento antes de [place]. Tome de 400 a 800 microgramos (mcg) de cido [employer] si [place]. Pida mtodos de control de la natalidad (anticonceptivos) si desea evitar un embarazo no deseado. Osteoporosis y [place] La osteoporosis es una enfermedad en la que los huesos pierden los minerales y la fuerza por el avance de la edad. El resultado pueden ser fracturas en los [place]. Si tiene 65 aos o ms, o si est en riesgo de sufrir osteoporosis y fracturas, pregunte a su mdico si debe: Hacerse pruebas de deteccin de prdida sea. Tomar un suplemento de calcio o de vitamina D para reducir el riesgo de fracturas. Recibir terapia de reemplazo hormonal (TRH) para tratar los sntomas de la menopausia. Siga estas indicaciones en su casa: Consumo de alcohol No beba alcohol si: Su mdico le indica no hacerlo. Est embarazada, puede estar embarazada o est tratando de [place]. Si bebe alcohol: Limite la cantidad que bebe a lo siguiente: De 0 a 1 bebida por da. Sepa cunta cantidad de alcohol hay en las bebidas que toma. En los [address], una medida equivale a una botella de cerveza de 12 oz (355 ml), un vaso de vino de 5 oz (148 ml) o un vaso de una bebida alcohlica de alta graduacin de 1 oz (44 ml). Estilo de vida No consuma ningn producto que contenga  nicotina o tabaco. Estos productos incluyen cigarrillos, tabaco para Theatre manager y aparatos de vapeo, como los Administrator, Civil Service. Si necesita ayuda para dejar de consumir estos productos, consulte al mdico. No consuma drogas. No comparta agujas. Solicite ayuda a su mdico si necesita apoyo o informacin para abandonar las drogas. Indicaciones generales Realcese los estudios de rutina de [address], dentales y de Wellsite geologist. Mantngase al da con las vacunas. Infrmele a su mdico si: Se siente deprimida con frecuencia. Alguna vez ha sido vctima de [place] o no se siente seguro en su casa. Resumen Adoptar un estilo de vida saludable y recibir atencin preventiva son importantes para promover la salud y Counsellor. Siga las instrucciones del mdico acerca de una dieta saludable, el ejercicio y la realizacin de pruebas o exmenes para Hotel manager. Siga las instrucciones del mdico con respecto al control del colesterol y la presin arterial. Esta informacin no tiene Theme park manager el consejo del mdico. Asegrese de hacerle al mdico cualquier pregunta que tenga. Document Revised: 04/04/2021 Document Reviewed: 04/04/2021 Elsevier Patient Education  2023 Elsevier Inc.     Edwina Barth, MD [facility]

## 2022-09-22 NOTE — Patient Instructions (Signed)

## 2022-09-23 LAB — HIV ANTIBODY (ROUTINE TESTING W REFLEX): HIV 1&2 Ab, 4th Generation: NONREACTIVE

## 2022-09-23 LAB — HEPATITIS C ANTIBODY: Hepatitis C Ab: NONREACTIVE

## 2022-10-15 IMAGING — MG DIGITAL DIAGNOSTIC BILAT W/ TOMO W/ CAD
8 series · 8 of 24 positions shown · non-contrast
Comparison: Previous exam(s).

CLINICAL DATA: Patient for follow-up of probably benign bilateral
breast masses.



[L MLO synth-2D]
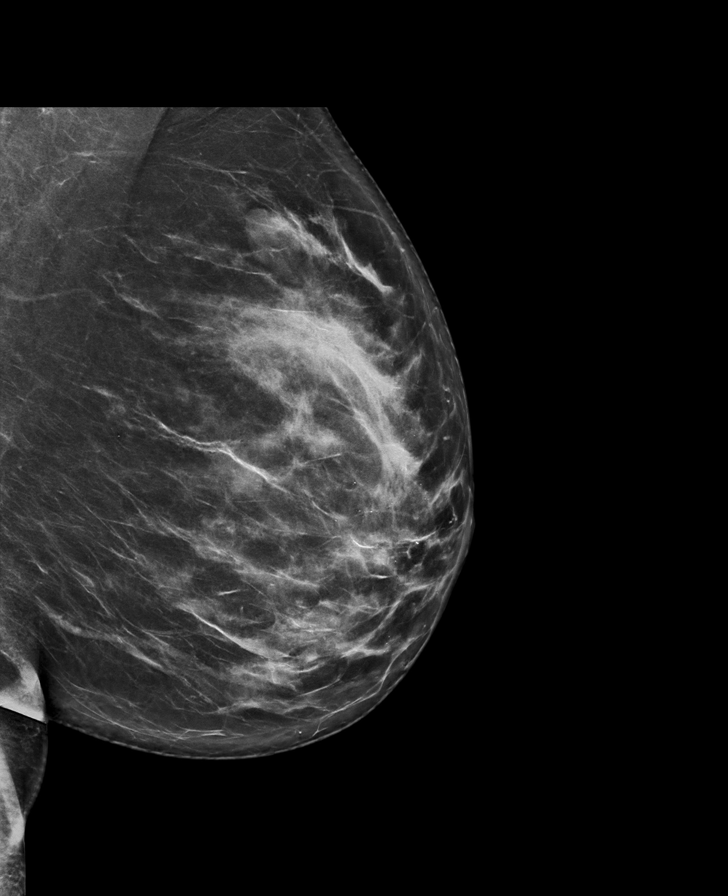

[L CC synth-2D]
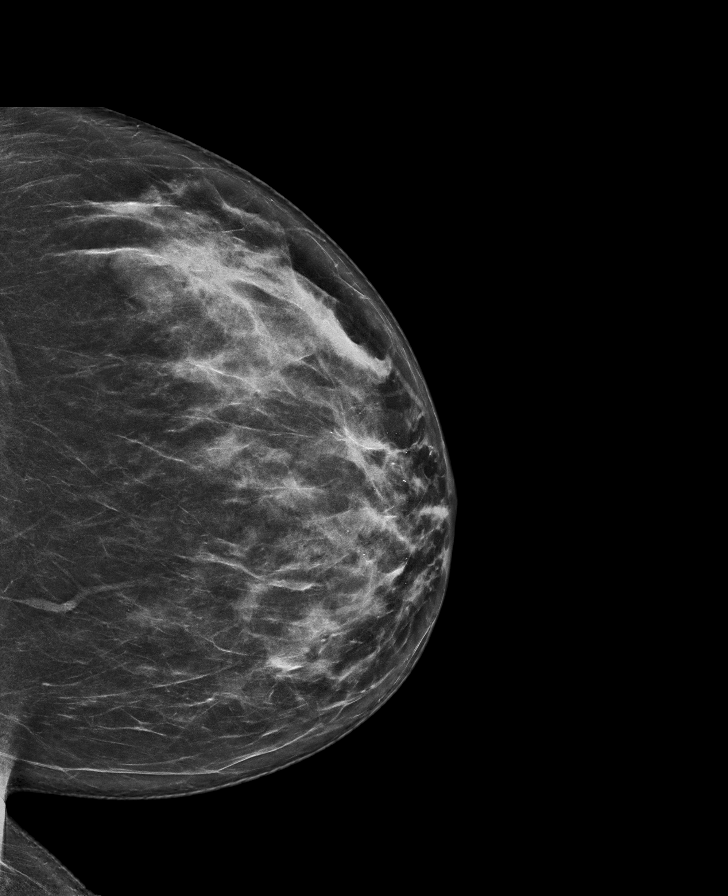

[R MLO synth-2D]
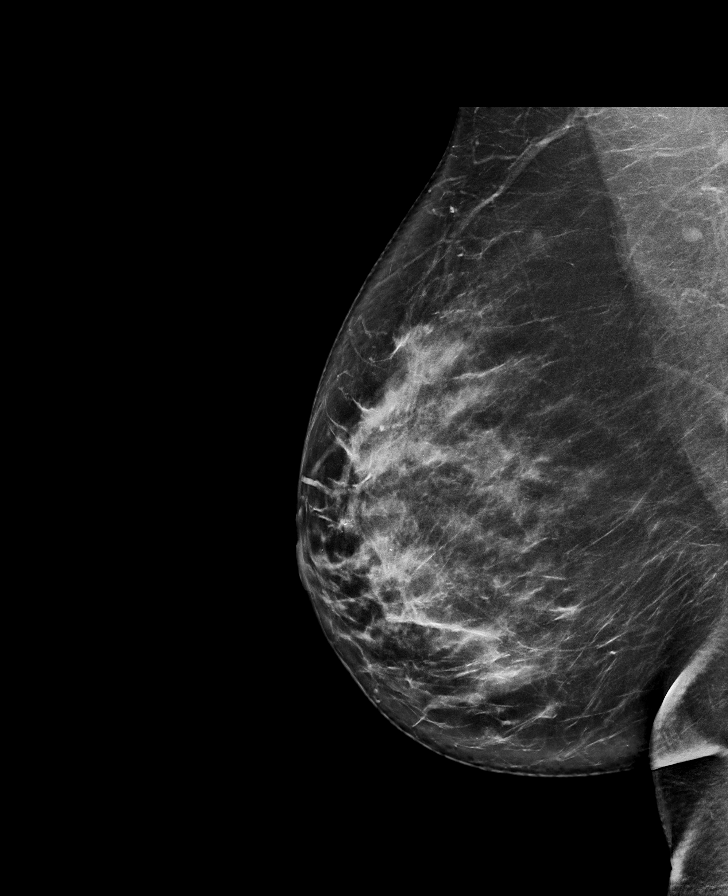

[R CC synth-2D]
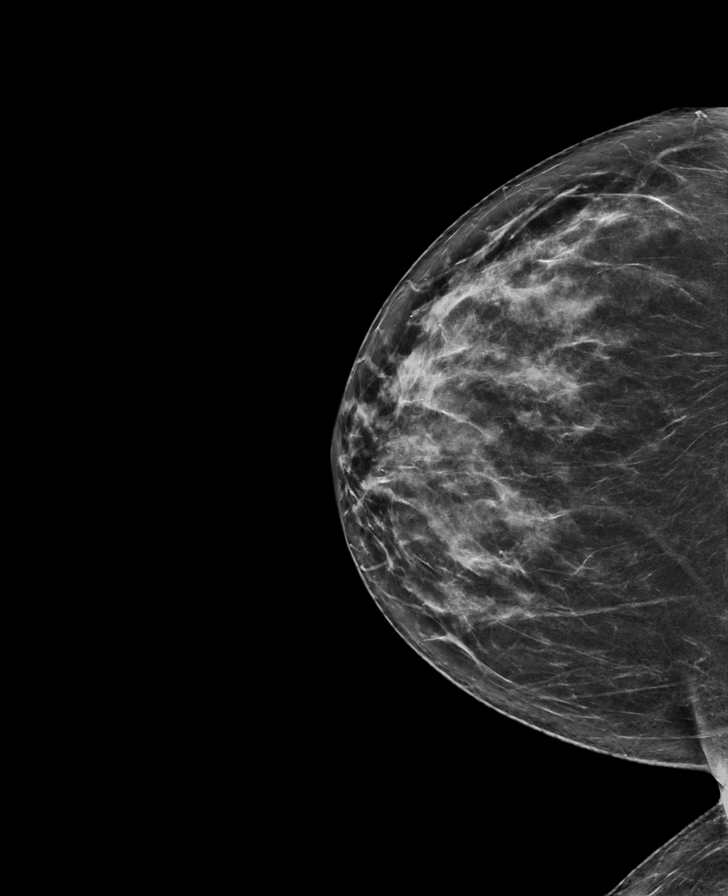

[R CC tomo · tomo slice 39/77.0]
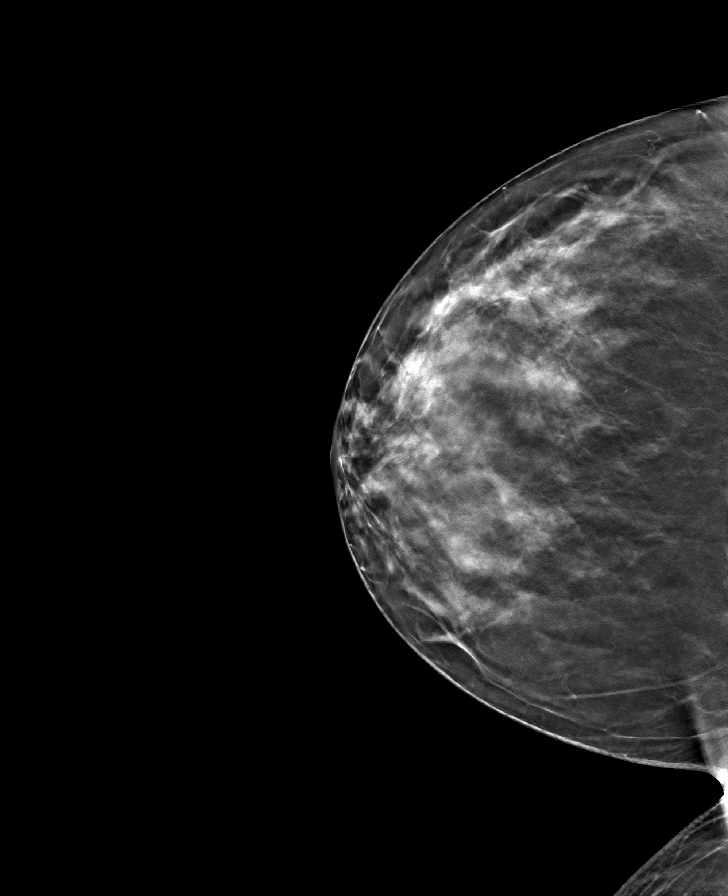

[L MLO tomo · tomo slice 41/82.0]
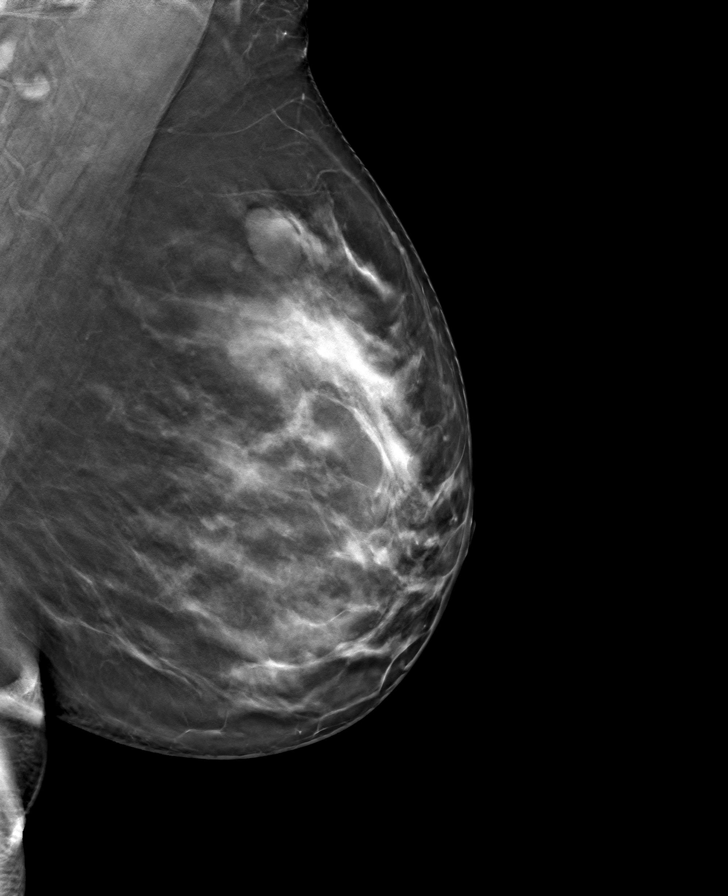

[R MLO tomo · tomo slice 43/84.0]
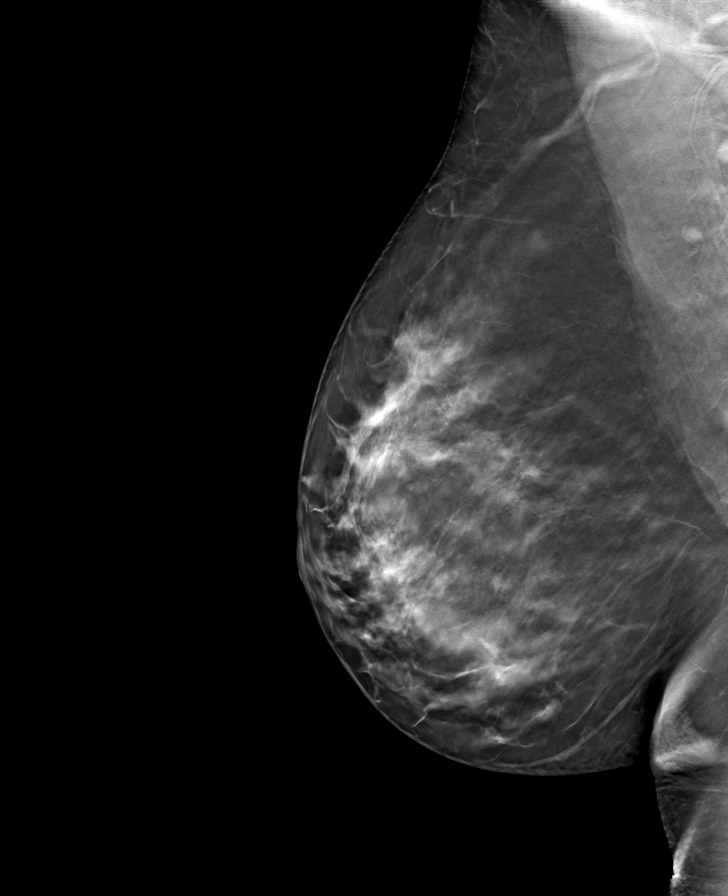

[L CC tomo · tomo slice 39/78.0]
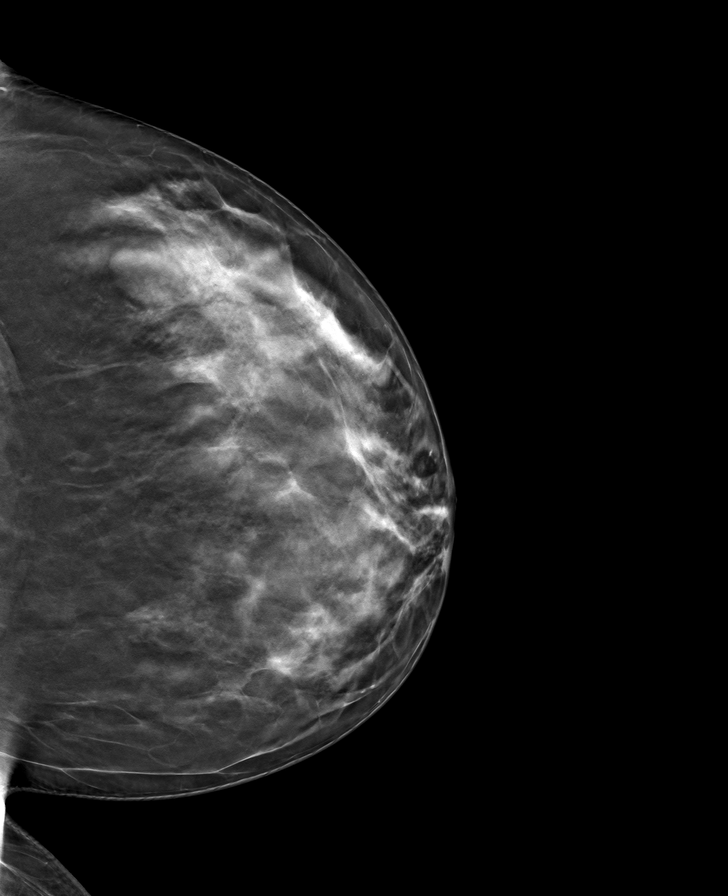

[8 of 24 positions shown; findings below may reference images not displayed]

ACR Breast Density Category c: The breast tissue is heterogeneously
dense, which may obscure small masses.
FINDINGS: Grossly unchanged oval circumscribed mass within the outer right
breast. Grossly unchanged oval circumscribed mass within the
upper-outer left breast. No additional new findings within either
breast.

Targeted ultrasound is performed, showing a stable 2.1 x 1.6 x
cm oval circumscribed mass left breast 2 o'clock position 9 cm from
nipple.

Within the right breast 9 o'clock position 3 cm from nipple stable
to mild interval decrease in size of 5 x 2 x 5 mm mildly complicated
cyst.
IMPRESSION: Stable probably benign bilateral breast masses.

RECOMMENDATION:
Bilateral diagnostic mammography and breast ultrasound in 12 months
to demonstrate 2 years of stability.

I have discussed the findings and recommendations with the patient.
If applicable, a reminder letter will be sent to the patient
regarding the next appointment.

BI-RADS CATEGORY  3: Probably benign.

## 2022-10-30 ENCOUNTER — Ambulatory Visit: Payer: Self-pay

## 2022-10-30 ENCOUNTER — Other Ambulatory Visit: Payer: Self-pay

## 2022-12-18 ENCOUNTER — Ambulatory Visit
Admission: RE | Admit: 2022-12-18 | Discharge: 2022-12-18 | Disposition: A | Discharge status: Home / Self Care | Payer: Self-pay | Source: Ambulatory Visit | Attending: Obstetrics and Gynecology | Admitting: Obstetrics and Gynecology

## 2022-12-18 ENCOUNTER — Ambulatory Visit: Payer: Self-pay | Admitting: Hematology and Oncology

## 2022-12-18 ENCOUNTER — Ambulatory Visit
Admission: RE | Admit: 2022-12-18 | Discharge: 2022-12-18 | Disposition: A | Discharge status: Home / Self Care | Payer: No Typology Code available for payment source | Source: Ambulatory Visit | Attending: Obstetrics and Gynecology | Admitting: Obstetrics and Gynecology

## 2022-12-18 VITALS — BP 124/82 | Wt 164.7 lb

## 2022-12-18 DIAGNOSIS — Z87898 Personal history of other specified conditions: Secondary | ICD-10-CM

## 2022-12-18 DIAGNOSIS — Z1211 Encounter for screening for malignant neoplasm of colon: Secondary | ICD-10-CM

## 2022-12-18 NOTE — Progress Notes (Signed)
[name] is a 57 y.o. female who presents to [facility] clinic today with no complaints. Follow up of probable benign bilateral masses.   Pap Smear: Pap not smear completed today. Last Pap smear was 2022 and was normal. Per patient has no history of an abnormal Pap smear. Last Pap smear result is available in Epic.   Physical exam: Breasts Breasts symmetrical. No skin abnormalities bilateral breasts. No nipple retraction bilateral breasts. No nipple discharge bilateral breasts. No lymphadenopathy. No lumps palpated bilateral breasts.     MS DIGITAL DIAG TOMO BILAT  Result Date: 10/24/2021 CLINICAL DATA:  Patient for follow-up of probably benign bilateral breast masses. EXAM: DIGITAL DIAGNOSTIC BILATERAL MAMMOGRAM WITH TOMOSYNTHESIS AND CAD; ULTRASOUND RIGHT BREAST LIMITED; ULTRASOUND LEFT BREAST LIMITED TECHNIQUE: Bilateral digital diagnostic mammography and breast tomosynthesis was performed. The images were evaluated with computer-aided detection.; Targeted ultrasound examination of the right breast was performed; Targeted ultrasound examination of the left breast was performed. COMPARISON:  Previous exam(s). ACR Breast Density Category c: The breast tissue is heterogeneously dense, which may obscure small masses. FINDINGS: Grossly unchanged oval circumscribed mass within the outer right breast. Grossly unchanged oval circumscribed mass within the upper-outer left breast. No additional new findings within either breast. Targeted ultrasound is performed, showing a stable 2.1 x 1.6 x 0.7 cm oval circumscribed mass left breast 2 o'clock position 9 cm from nipple. Within the right breast 9 o'clock position 3 cm from nipple stable to mild interval decrease in size of 5 x 2 x 5 mm mildly complicated cyst. IMPRESSION: Stable probably benign bilateral breast masses. RECOMMENDATION: Bilateral diagnostic mammography and breast ultrasound in 12 months to demonstrate 2 years of stability. I have  discussed the findings and recommendations with the patient. If applicable, a reminder letter will be sent to the patient regarding the next appointment. BI-RADS CATEGORY  3: Probably benign. Electronically Signed   By: Lovey Newcomer M.D.   On: 10/24/2021 14:15  MS DIGITAL DIAG TOMO BILAT  Result Date: 10/02/2020 CLINICAL DATA:  57 year old female for further evaluation of possible bilateral breast masses on new baseline screening mammogram. EXAM: DIGITAL DIAGNOSTIC BILATERAL MAMMOGRAM WITH TOMO ULTRASOUND BILATERAL BREAST COMPARISON:  Previous exam(s). ACR Breast Density Category c: The breast tissue is heterogeneously dense, which may obscure small masses. FINDINGS: 2D/3D spot compression views of both breasts are performed. A persistent circumscribed oval mass within the OUTER RIGHT breast is noted. A persistent circumscribed oval mass within the UPPER-OUTER LEFT breast is identified. Targeted ultrasound is performed, showing the following: A 0.6 x 0.4 x 0.7 cm circumscribed oval near anechoic mass at the 9 o'clock position of the RIGHT breast 3 cm from the nipple, corresponding to the screening study finding and most likely representing a complicated cyst. 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 IMPRESSION: 1. 0.7 cm likely benign mass/complicated cyst within the OUTER RIGHT breast corresponding to the screening study finding. Six-month follow-up recommended. 2. 2.1 cm likely benign mass/probable fibroadenoma within the UPPER OUTER LEFT breast corresponding to the screening study finding. Six-month follow-up recommended. RECOMMENDATION: Bilateral breast ultrasound in 6 months. I have discussed the findings and recommendations with the patient. If applicable, a reminder letter will be sent to the patient regarding the next  appointment. BI-RADS CATEGORY  3: Probably benign. Electronically Signed  By: Margarette Canada M.D.   On: 10/02/2020 09:56   MS DIGITAL SCREENING TOMO BILATERAL  Result Date: 09/17/2020 CLINICAL DATA:  Screening. EXAM: DIGITAL SCREENING BILATERAL MAMMOGRAM WITH TOMO AND CAD COMPARISON:  None. ACR Breast Density Category c: The breast tissue is heterogeneously dense, which may obscure small masses. FINDINGS: In the right breast, a possible mass warrants further evaluation. In the left breast, a possible mass warrants further evaluation. Images were processed with CAD. IMPRESSION: Further evaluation is suggested for possible masses in the right and left breasts. RECOMMENDATION: Diagnostic mammogram and possibly ultrasound of the right and left breasts. (Code:[id number]) The patient will be contacted regarding the findings, and additional imaging will be scheduled. BI-RADS CATEGORY  0: Incomplete. Need additional imaging evaluation and/or prior mammograms for comparison. Electronically Signed   By: Lajean Manes M.D.   On: 09/17/2020 15:29      Pelvic/Bimanual Pap is not indicated today    Smoking History: Patient has never smoked and was not referred to quit line.    Patient Navigation: Patient education provided. Access to services provided for patient through [place] interpreter provided. No transportation provided   Colorectal Cancer Screening: Per patient has never had colonoscopy completed No complaints today. FIT test given.   Breast and Cervical Cancer Risk Assessment: Patient does not have family history of breast cancer, known genetic mutations, or radiation treatment to the chest before age 62. Patient does not have history of cervical dysplasia, immunocompromised, or DES exposure in-utero.  Risk Assessment   No risk assessment data for the current encounter  Risk Scores       10/24/2021   Last edited by: Demetrius Revel, LPN   5-year risk: 0.5 %   Lifetime  risk: 4.3 %            A: [facility] exam without pap smear No complaints with benign exam.   P: Referred patient to the [facility] for a diagnostic mammogram. Appointment scheduled 12/18/22.  Dayton Scrape A, NP 12/18/2022 8:20 AM

## 2022-12-18 NOTE — Patient Instructions (Signed)
Taught Hannah Tate about self breast awareness and gave educational materials to take home. Patient did not need a Pap smear today due to last Pap smear was in 2022 per patient. Let her know [facility] will cover Pap smears every 5 years unless has a history of abnormal Pap smears. Referred [facility] to the [facility] for diagnostic mammogram. Appointment scheduled for 12/18/22. Patient aware of appointment and will be there. Let patient know will follow up with her within the next couple weeks with results. Hannah Tate verbalized understanding.  Melodye Ped, NP 8:40 AM

## 2022-12-27 LAB — FECAL OCCULT BLOOD, IMMUNOCHEMICAL: Fecal Occult Bld: NEGATIVE

## 2023-11-17 ENCOUNTER — Other Ambulatory Visit: Payer: Self-pay | Admitting: Obstetrics and Gynecology

## 2023-11-17 ENCOUNTER — Encounter: Payer: Self-pay | Admitting: Obstetrics and Gynecology

## 2023-11-17 ENCOUNTER — Telehealth: Payer: Self-pay

## 2023-11-17 DIAGNOSIS — Z1231 Encounter for screening mammogram for malignant neoplasm of breast: Secondary | ICD-10-CM

## 2023-11-17 NOTE — Telephone Encounter (Signed)
 Via, Hannah Tate. [employer] Research Officer, [employer], Patient called to schedule [facility] appointment,and stated that she has Left breast pain, the same as in 2023. Pain has occurred 5 times within 1 month in the outer left breast. Patient stated she only drinks 1 cup of coffee per day, denies consumption of any other types of caffeine throughout the day, no lumps/masses, no skin changes, no discharge. Pain is rated at a 5 out of 10, comes and goes, is not doing anything that she has noticed to cause the pain. Per 12/18/2022 diagnostic mammogram/ ultrasound reports, patient has benign cysts, was informed can return screening mammogram in 1 year. Patient stated she is aware that she needs to do a screening mammogram. Patient informed, needs to schedule B[facility] appointment as planned for Screening appointment, but if needed, provider will change to diagnostic after exam. Patient verbalized understanding.

## 2023-12-29 ENCOUNTER — Ambulatory Visit: Payer: Self-pay

## 2024-01-14 ENCOUNTER — Ambulatory Visit
Admission: RE | Admit: 2024-01-14 | Discharge: 2024-01-14 | Disposition: A | Discharge status: Home / Self Care | Payer: Self-pay | Source: Ambulatory Visit | Attending: Obstetrics and Gynecology | Admitting: Obstetrics and Gynecology

## 2024-01-14 ENCOUNTER — Ambulatory Visit: Payer: Self-pay

## 2024-01-14 ENCOUNTER — Ambulatory Visit: Payer: Self-pay | Admitting: *Deleted

## 2024-01-14 VITALS — BP 120/84 | Wt 164.4 lb

## 2024-01-14 DIAGNOSIS — Z1239 Encounter for other screening for malignant neoplasm of breast: Secondary | ICD-10-CM

## 2024-01-14 DIAGNOSIS — Z1211 Encounter for screening for malignant neoplasm of colon: Secondary | ICD-10-CM

## 2024-01-14 DIAGNOSIS — Z1231 Encounter for screening mammogram for malignant neoplasm of breast: Secondary | ICD-10-CM

## 2024-01-14 NOTE — Progress Notes (Signed)
 [name] is a 58 y.o. female who presents to [facility] clinic today with no complaints.    Pap Smear: Pap smear not completed today. Last Pap smear was 10/24/2021 at [facility] clinic and was normal with negative HPV. Per patient has history of an abnormal Pap smear 16 years ago that a colposcopy was completed for follow-up. Patient stated all Pap smears have been normal since colposcopy and has had at least three normal Pap smears. Last Pap smear result is available in Epic.    Physical exam: Breasts Left breast slightly larger than right that per patient is normal for her. No skin abnormalities bilateral breasts. No nipple retraction bilateral breasts. No nipple discharge bilateral breasts. No lymphadenopathy. No lumps palpated bilateral breasts. No complaints of pain or tenderness on exam.         MS DIGITAL DIAG TOMO BILAT Result Date: 12/18/2022 CLINICAL DATA:  2+ year interval follow-up of likely benign masses in both breasts, originally identified on baseline screening mammography in October, 2021. EXAM: DIGITAL DIAGNOSTIC BILATERAL MAMMOGRAM WITH TOMOSYNTHESIS; ULTRASOUND RIGHT BREAST LIMITED; ULTRASOUND LEFT BREAST LIMITED TECHNIQUE: Bilateral digital diagnostic mammography and breast tomosynthesis was performed.; Targeted ultrasound examination of the right breast was performed; Targeted ultrasound examination of the left breast was performed. COMPARISON:  Previous exam(s). ACR Breast Density Category c: The breast tissue is heterogeneously dense, which may obscure small masses. FINDINGS: Full field CC and MLO views of both breasts were obtained. RIGHT: Circumscribed isodense mass in the outer breast at middle depth measuring approximately 1 cm in size, unchanged dating back to the baseline mammogram in October, 2021. No new or suspicious findings. Targeted ultrasound is performed, again demonstrating the oval parallel circumscribed clustered cysts at the 9 o'clock position 3 cm from nipple  measuring approximately 0.6 x 0.3 x 0.5 cm, demonstrating posterior acoustic enhancement, unchanged dating back to 10/02/2020. LEFT: Circumscribed isodense mass in the UPPER OUTER QUADRANT at posterior depth measuring just over 2 cm in size, unchanged dating back to the baseline mammogram in October, 2021. No new or suspicious findings. Targeted ultrasound is performed, again demonstrating the oval circumscribed parallel hypoechoic mass at the 2 o'clock position 9 cm from nipple at posterior depth measuring approximately 2.1 x 0.9 x 1.5 cm, demonstrating no posterior characteristics, unchanged dating back to 10/02/2020. IMPRESSION: 1. No mammographic or sonographic evidence of malignancy involving either breast. 2. 0.6 cm clustered cysts involving the outer RIGHT breast at 9 o'clock 3 cm from the nipple, unchanged dating back to November, 2021, confirming benignity. 3. 2.1 cm mass involving the UPPER OUTER QUADRANT of the LEFT breast at 2 o'clock 9 cm from nipple, unchanged dating back to November, 2021, confirming benignity, likely a fibroadenoma. RECOMMENDATION: Screening mammogram in one year.(Code:SM-B-01Y) I have discussed the findings and recommendations with the patient. Communication with the patient was achieved with the assistance of a certified interpreter. If applicable, a reminder letter will be sent to the patient regarding the next appointment. BI-RADS CATEGORY  2: Benign. Electronically Signed   By: Hulan Saas M.D.   On: 12/18/2022 11:26   MS DIGITAL DIAG TOMO BILAT Result Date: 10/24/2021 CLINICAL DATA:  Patient for follow-up of probably benign bilateral breast masses. EXAM: DIGITAL DIAGNOSTIC BILATERAL MAMMOGRAM WITH TOMOSYNTHESIS AND CAD; ULTRASOUND RIGHT BREAST LIMITED; ULTRASOUND LEFT BREAST LIMITED TECHNIQUE: Bilateral digital diagnostic mammography and breast tomosynthesis was performed. The images were evaluated with computer-aided detection.; Targeted ultrasound examination of the  right breast was performed; Targeted ultrasound examination of  the left breast was performed. COMPARISON:  Previous exam(s). ACR Breast Density Category c: The breast tissue is heterogeneously dense, which may obscure small masses. FINDINGS: Grossly unchanged oval circumscribed mass within the outer right breast. Grossly unchanged oval circumscribed mass within the upper-outer left breast. No additional new findings within either breast. Targeted ultrasound is performed, showing a stable 2.1 x 1.6 x 0.7 cm oval circumscribed mass left breast 2 o'clock position 9 cm from nipple. Within the right breast 9 o'clock position 3 cm from nipple stable to mild interval decrease in size of 5 x 2 x 5 mm mildly complicated cyst. IMPRESSION: Stable probably benign bilateral breast masses. RECOMMENDATION: Bilateral diagnostic mammography and breast ultrasound in 12 months to demonstrate 2 years of stability. I have discussed the findings and recommendations with the patient. If applicable, a reminder letter will be sent to the patient regarding the next appointment. BI-RADS CATEGORY  3: Probably benign. Electronically Signed   By: Annia Belt M.D.   On: 10/24/2021 14:15  MS DIGITAL DIAG TOMO BILAT Result Date: 10/02/2020 CLINICAL DATA:  58 year old female for further evaluation of possible bilateral breast masses on new baseline screening mammogram. EXAM: DIGITAL DIAGNOSTIC BILATERAL MAMMOGRAM WITH TOMO ULTRASOUND BILATERAL BREAST COMPARISON:  Previous exam(s). ACR Breast Density Category c: The breast tissue is heterogeneously dense, which may obscure small masses. FINDINGS: 2D/3D spot compression views of both breasts are performed. A persistent circumscribed oval mass within the OUTER RIGHT breast is noted. A persistent circumscribed oval mass within the UPPER-OUTER LEFT breast is identified. Targeted ultrasound is performed, showing the following: A 0.6 x 0.4 x 0.7 cm circumscribed oval near anechoic mass at the 9 o'clock  position of the RIGHT breast 3 cm from the nipple, corresponding to the screening study finding and most likely representing a complicated cyst. 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 IMPRESSION: 1. 0.7 cm likely benign mass/complicated cyst within the OUTER RIGHT breast corresponding to the screening study finding. Six-month follow-up recommended. 2. 2.1 cm likely benign mass/probable fibroadenoma within the UPPER OUTER LEFT breast corresponding to the screening study finding. Six-month follow-up recommended. RECOMMENDATION: Bilateral breast ultrasound in 6 months. I have discussed the findings and recommendations with the patient. If applicable, a reminder letter will be sent to the patient regarding the next appointment. BI-RADS CATEGORY  3: Probably benign. Electronically Signed   By: Harmon Pier M.D.   On: 10/02/2020 09:56   MS DIGITAL SCREENING TOMO BILATERAL Result Date: 09/17/2020 CLINICAL DATA:  Screening. EXAM: DIGITAL SCREENING BILATERAL MAMMOGRAM WITH TOMO AND CAD COMPARISON:  None. ACR Breast Density Category c: The breast tissue is heterogeneously dense, which may obscure small masses. FINDINGS: In the right breast, a possible mass warrants further evaluation. In the left breast, a possible mass warrants further evaluation. Images were processed with CAD. IMPRESSION: Further evaluation is suggested for possible masses in the right and left breasts. RECOMMENDATION: Diagnostic mammogram and possibly ultrasound of the right and left breasts. (Code:[id number]-50M) The patient will be contacted regarding the findings, and additional imaging will be scheduled. BI-RADS CATEGORY  0: Incomplete. Need additional imaging evaluation and/or prior mammograms for comparison. Electronically Signed   By: Amie Portland M.D.   On: 09/17/2020 15:29     Pelvic/Bimanual Pap is not indicated today per BCCCP guidelines.   Smoking History:  Patient has never smoked.   Patient Navigation: Patient education provided. Access to services provided for patient through [place] program. Spanish interpreter Hannah Tate from [facility] provided.   Colorectal Cancer Screening: Per patient has never had colonoscopy completed. FIT Test completed 12/22/2022 that was negative. FIT Test given to patient today to complete. No complaints today.    Breast and Cervical Cancer Risk Assessment: Patient does not have family history of breast cancer, known genetic mutations, or radiation treatment to the chest before age 34. Per patient has history of cervical dysplasia. Patient has no history of being immunocompromised or DES exposure in-utero.   Risk Scores as of Encounter on 01/14/2024     Hannah Tate           5-year 0.96%   Lifetime 6.75%   This patient is [place]/Lat[place] but has no documented birth country, so the [place] model used data from [place] patients to calculate their risk score. Document a birth country in the Demographics activity for a more accurate score.         Last calculated by Hannah Tate, CMA on 01/14/2024 at  9:25 AM        A: BCCCP exam without pap smear No complaints.  P: Referred patient to the [facility] for a screening mammogram on mobile unit. Appointment scheduled Thursday, January 14, 2024 at 1030.  Priscille Heidelberg, RN 01/14/2024 9:28 AM

## 2024-01-14 NOTE — Patient Instructions (Signed)
 Explained breast self awareness with Dawna Part. Patient did not need a Pap smear today due to last Pap smear and HPV typing was 10/24/2021. Let her know BCCCP will cover Pap smears and HPV typing every 5 years unless has a history of abnormal Pap smears. Referred patient to the [facility] for a screening mammogram on mobile unit. Appointment scheduled Thursday, January 14, 2024 at 1030. Patient aware of appointment and will be there. Let patient know the Breast Center will follow up with her within the next couple weeks with results of mammogram by letter or phone. Dawna Part verbalized understanding.  Lurlean Kernen, Kathaleen Maser, RN 9:28 AM

## 2024-01-21 ENCOUNTER — Encounter: Payer: Self-pay | Admitting: Obstetrics & Gynecology

## 2024-01-23 LAB — FECAL OCCULT BLOOD, IMMUNOCHEMICAL: Fecal Occult Bld: NEGATIVE

## 2024-01-26 NOTE — Progress Notes (Signed)
Normal FIT test letter mailed to patient.
